# Patient Record
Sex: Male | Born: 1965
Health system: Southern US, Community
[De-identification: ages and names within clinical notes are randomized; demographics above are authoritative.]

## PROBLEM LIST (undated history)

## (undated) DIAGNOSIS — H40009 Preglaucoma, unspecified, unspecified eye: Secondary | ICD-10-CM

## (undated) DIAGNOSIS — Z8619 Personal history of other infectious and parasitic diseases: Secondary | ICD-10-CM

## (undated) DIAGNOSIS — M549 Dorsalgia, unspecified: Secondary | ICD-10-CM

## (undated) DIAGNOSIS — E119 Type 2 diabetes mellitus without complications: Secondary | ICD-10-CM

## (undated) DIAGNOSIS — G8929 Other chronic pain: Secondary | ICD-10-CM

## (undated) HISTORY — PX: NO PAST SURGERIES: SHX2092

## (undated) HISTORY — DX: Type 2 diabetes mellitus without complications: E11.9

## (undated) HISTORY — DX: Other chronic pain: G89.29

## (undated) HISTORY — DX: Preglaucoma, unspecified, unspecified eye: H40.009

## (undated) HISTORY — DX: Personal history of other infectious and parasitic diseases: Z86.19

## (undated) HISTORY — DX: Dorsalgia, unspecified: M54.9

---

## 2012-10-02 ENCOUNTER — Ambulatory Visit (INDEPENDENT_AMBULATORY_CARE_PROVIDER_SITE_OTHER): Payer: BC Managed Care – PPO | Admitting: Internal Medicine

## 2012-10-02 VITALS — BP 114/76 | HR 81 | Temp 98.0°F | Resp 17 | Ht 71.0 in | Wt 197.0 lb

## 2012-10-02 DIAGNOSIS — Z7185 Encounter for immunization safety counseling: Secondary | ICD-10-CM

## 2012-10-02 DIAGNOSIS — Z0289 Encounter for other administrative examinations: Secondary | ICD-10-CM

## 2012-10-02 DIAGNOSIS — R229 Localized swelling, mass and lump, unspecified: Secondary | ICD-10-CM

## 2012-10-02 DIAGNOSIS — M779 Enthesopathy, unspecified: Secondary | ICD-10-CM

## 2012-10-02 DIAGNOSIS — Z7189 Other specified counseling: Secondary | ICD-10-CM

## 2012-10-02 DIAGNOSIS — R2231 Localized swelling, mass and lump, right upper limb: Secondary | ICD-10-CM

## 2012-10-02 NOTE — Progress Notes (Signed)
  Subjective:    Patient ID: Samuel Rodriguez, male    DOB: 01-25-1966, 47 y.o.   MRN: [id number]  HPI Needs school physical, also has enlarging mass right upper arm, is changing and getting firmer. Has hx of locking or cramping left middle finger. Needs PPD [place].   Review of Systems All neg otherwise    Objective:   Physical Exam  Vitals reviewed. Constitutional: He is oriented to person, place, and time. He appears well-developed and well-nourished. No distress.  Neck: Normal range of motion.  Cardiovascular: Normal rate, regular rhythm and normal heart sounds.   Pulmonary/Chest: Effort normal and breath sounds normal.  Abdominal: Soft. There is no tenderness.  Musculoskeletal: Normal range of motion.  Neurological: He is alert and oriented to person, place, and time.  Skin: Rash noted.  Psychiatric: He has a normal mood and affect. His behavior is normal. Judgment and thought content normal.   Lesion right upper arm very firm and partially fixed       Assessment & Plan:  Skin lesion right upper arm/refer to derm to remove PPD left fore arm/read 48-72 hrs

## 2012-10-02 NOTE — Progress Notes (Signed)
  Tuberculosis Risk Questionnaire  1. Yes Were you born outside the [place] in one of the following parts of the world: [place], [place], [place], [place] or [place]?    2. No Have you traveled outside the [place] and lived for more than one month in one of the following parts of the world: [place], [place], [place], [place] or [place]?    3. No Do you have a compromised immune system such as from any of the following conditions:HIV/AIDS, organ or bone marrow transplantation, diabetes, immunosuppressive medicines (e.g. Prednisone, Remicaide), leukemia, lymphoma, cancer of the head or neck, gastrectomy or jejunal bypass, end-stage renal disease (on dialysis), or silicosis?     4. No Have you ever or do you plan on working in: a residential care center, a health care facility, a jail or prison or homeless shelter?    5. No Have you ever: injected illegal drugs, used crack cocaine, lived in a homeless shelter  or been in jail or prison?     6. No Have you ever been exposed to anyone with infectious tuberculosis?    Tuberculosis Symptom Questionnaire  Do you currently have any of the following symptoms?  1. No Unexplained cough lasting more than 3 weeks?   2. No Unexplained fever lasting more than 3 weeks.   3. No Night Sweats (sweating that leaves the bedclothes and sheets wet)     4. No Shortness of Breath   5. No Chest Pain   6. No Unintentional weight loss    7. No  Unexplained fatigue (very tired for no reason)   

## 2012-10-02 NOTE — Patient Instructions (Signed)
DASH Diet  The DASH diet stands for "Dietary Approaches to Stop Hypertension." It is a healthy eating plan that has been shown to reduce high blood pressure (hypertension) in as little as 14 days, while also possibly providing other significant health benefits. These other health benefits include reducing the risk of breast cancer after menopause and reducing the risk of type 2 diabetes, heart disease, colon cancer, and stroke. Health benefits also include weight loss and slowing kidney failure in patients with chronic kidney disease.   DIET GUIDELINES  · Limit salt (sodium). Your diet should contain less than 1500 mg of sodium daily.  · Limit refined or processed carbohydrates. Your diet should include mostly whole grains. Desserts and added sugars should be used sparingly.  · Include small amounts of heart-healthy fats. These types of fats include nuts, oils, and tub margarine. Limit saturated and trans fats. These fats have been shown to be harmful in the body.  CHOOSING FOODS   The following food groups are based on a 2000 calorie diet. See your Registered Dietitian for individual calorie needs.  Grains and Grain Products (6 to 8 servings daily)  · Eat More Often: Whole-wheat bread, brown rice, whole-grain or wheat pasta, quinoa, popcorn without added fat or salt (air popped).  · Eat Less Often: White bread, white pasta, white rice, cornbread.  Vegetables (4 to 5 servings daily)  · Eat More Often: Fresh, frozen, and canned vegetables. Vegetables may be raw, steamed, roasted, or grilled with a minimal amount of fat.  · Eat Less Often/Avoid: Creamed or fried vegetables. Vegetables in a cheese sauce.  Fruit (4 to 5 servings daily)  · Eat More Often: All fresh, canned (in natural juice), or frozen fruits. Dried fruits without added sugar. One hundred percent fruit juice (½ cup [237 mL] daily).  · Eat Less Often: Dried fruits with added sugar. Canned fruit in light or heavy syrup.  Lean Meats, Fish, and Poultry (2  servings or less daily. One serving is 3 to 4 oz [85-114 g]).  · Eat More Often: Ninety percent or leaner ground beef, tenderloin, sirloin. Round cuts of beef, chicken breast, turkey breast. All fish. Grill, bake, or broil your meat. Nothing should be fried.  · Eat Less Often/Avoid: Fatty cuts of meat, turkey, or chicken leg, thigh, or wing. Fried cuts of meat or fish.  Dairy (2 to 3 servings)  · Eat More Often: Low-fat or fat-free milk, low-fat plain or light yogurt, reduced-fat or part-skim cheese.  · Eat Less Often/Avoid: Milk (whole, 2%). Whole milk yogurt. Full-fat cheeses.  Nuts, Seeds, and Legumes (4 to 5 servings per week)  · Eat More Often: All without added salt.  · Eat Less Often/Avoid: Salted nuts and seeds, canned beans with added salt.  Fats and Sweets (limited)  · Eat More Often: Vegetable oils, tub margarines without trans fats, sugar-free gelatin. Mayonnaise and salad dressings.  · Eat Less Often/Avoid: Coconut oils, palm oils, butter, stick margarine, cream, half and half, cookies, candy, pie.  FOR MORE INFORMATION  The Dash Diet Eating Plan: www.dashdiet.org  Document Released: 01/24/2011 Document Revised: 04/29/2011 Document Reviewed: 01/24/2011  ExitCare® Patient Information ©2014 [employer].

## 2012-10-04 ENCOUNTER — Ambulatory Visit (INDEPENDENT_AMBULATORY_CARE_PROVIDER_SITE_OTHER): Payer: BC Managed Care – PPO

## 2012-10-04 DIAGNOSIS — Z7185 Encounter for immunization safety counseling: Secondary | ICD-10-CM

## 2012-10-04 DIAGNOSIS — Z111 Encounter for screening for respiratory tuberculosis: Secondary | ICD-10-CM

## 2012-10-04 DIAGNOSIS — Z7189 Other specified counseling: Secondary | ICD-10-CM

## 2012-10-04 LAB — TB SKIN TEST
Induration: NEGATIVE mm
TB Skin Test: NEGATIVE

## 2013-12-17 ENCOUNTER — Emergency Department (HOSPITAL_COMMUNITY)
Admission: EM | Admit: 2013-12-17 | Discharge: 2013-12-17 | Disposition: A | Discharge status: Home / Self Care | Payer: No Typology Code available for payment source | Attending: Emergency Medicine | Admitting: Emergency Medicine

## 2013-12-17 ENCOUNTER — Emergency Department (HOSPITAL_COMMUNITY): Payer: No Typology Code available for payment source

## 2013-12-17 ENCOUNTER — Encounter (HOSPITAL_COMMUNITY): Payer: Self-pay | Admitting: Emergency Medicine

## 2013-12-17 DIAGNOSIS — M5416 Radiculopathy, lumbar region: Secondary | ICD-10-CM | POA: Insufficient documentation

## 2013-12-17 DIAGNOSIS — R52 Pain, unspecified: Secondary | ICD-10-CM

## 2013-12-17 MED ORDER — OXYCODONE-ACETAMINOPHEN 5-325 MG PO TABS: 1.0000 | ORAL_TABLET | ORAL | Status: DC | PRN

## 2013-12-17 MED ORDER — IBUPROFEN 800 MG PO TABS: 800.0000 mg | ORAL_TABLET | Freq: Three times a day (TID) | ORAL | Status: DC | PRN

## 2013-12-17 MED ORDER — CYCLOBENZAPRINE HCL 10 MG PO TABS: 10.0000 mg | ORAL_TABLET | Freq: Three times a day (TID) | ORAL | Status: DC | PRN

## 2013-12-17 MED ORDER — HYDROMORPHONE HCL 1 MG/ML IJ SOLN
2.0000 mg | Freq: Once | INTRAMUSCULAR | Status: AC
  Administered 2013-12-17: 2 mg via INTRAMUSCULAR
  Filled 2013-12-17: qty 2

## 2013-12-17 MED ORDER — PREDNISONE 50 MG PO TABS: ORAL_TABLET | ORAL | Status: DC

## 2013-12-17 NOTE — ED Notes (Signed)
The pt is c/o lt hip and entire lt leg pain for 2 weeks getting worse.  If he stands his pain is better. He also has some lt leg numbness

## 2013-12-17 NOTE — ED Provider Notes (Signed)
CSN: [id number]     Arrival date & time 12/17/13  2043 History  This chart was scribed for Samuel Rodriguez, [place], working with [name], DO found by Elon SpannerGarrett Cook, ED Scribe. This patient was seen in room TR11C/TR11C and the patient's care was started at 9:17 PM.   Chief Complaint  Patient presents with  . Hip Pain   The history is provided by the patient. No language interpreter was used.   HPI Comments: Dewitt HoesFernando V Rodriguez is a 48 y.o. male who presents to the Emergency Department complaining of left hip pain onset two weeks ago.  Patient reports he slept on a hard floor on his right side for 15 minutes two weeks ago.  When he awoke he turned onto his left side and heard a sound from his left hip.  Since this time the patient has had waxing and waning, worsening left hip, left knee, and left foot pain.  He reports that these three points of pain are related typically with one preceding the other and causing radiation either up or down his leg.  Patient reports that initially bearing weight is painful but the pain improves after walking several paces.  Similarly, the pain is reduced throughout the day as the patient feels his joint has "warmed up".  However, overuse can cause an increase in pain, such as walking long distances or bearing weight for long periods of time.  He also reports decreased sensation in his left leg from mid-calf down.  Patient has taken muscle relaxers and ibuprofen at home without relief.   Patient denies fall, injury.  Patient denies history of cancer.  Patient denies history of IV drug use. Patient denies fever, bowel/bladder incontinence or retention, abdominal pain, urinary or bowel symptoms .  Does feel weaker in his left leg secondary to pain.  NKA.   History reviewed. No pertinent past medical history. History reviewed. No pertinent past surgical history. No family history on file. History  Substance Use Topics  . Smoking status: Never Smoker   . Smokeless tobacco: Not  on file  . Alcohol Use: Yes    Review of Systems  Constitutional: Positive for chills. Negative for fever.  Cardiovascular: Negative for leg swelling.  Gastrointestinal: Negative for abdominal pain.  Musculoskeletal: Positive for arthralgias and back pain. Negative for gait problem and myalgias.  Skin: Negative for color change and wound.  Allergic/Immunologic: Negative for immunocompromised state.  Neurological: Positive for weakness and numbness.      Allergies  Review of patient's allergies indicates no known allergies.  Home Medications   Prior to Admission medications   Not on File   BP 146/97  Pulse 88  Temp(Src) 98.2 F (36.8 C)  Resp 18  Ht 5\' 10"  (1.778 m)  Wt 194 lb (87.998 kg)  BMI 27.84 kg/m2  SpO2 97% Physical Exam  Nursing note and vitals reviewed. Constitutional: He appears well-developed and well-nourished. No distress.  HENT:  Head: Normocephalic and atraumatic.  Neck: Neck supple.  Pulmonary/Chest: Effort normal.  Abdominal: Soft. He exhibits no distension and no mass. There is no tenderness. There is no rebound and no guarding.  Musculoskeletal:       Legs: Straight leg raise reproduces pain.  Pain 5/5 throughout left leg with exception of flexion of hip, somewhat decreased, likely secondary to pain.   Distal pulses intact.  No edema.    Neurological: He is alert.  Skin: He is not diaphoretic.  Psychiatric: He has a normal mood and  affect. His behavior is normal.    ED Course  Procedures (including critical care time)  DIAGNOSTIC STUDIES: Oxygen Saturation is 97% on RA, normal by my interpretation.    COORDINATION OF CARE:  9:30 PM Will order x-ray of lumbar back and hip.  Will order pain medication.  Will provide referral.  Labs Review Labs Reviewed - No data to display  Imaging Review Dg Lumbar Spine Complete  12/17/2013   CLINICAL DATA:  LEFT hip and leg pain for 2 weeks, after lying on floor. Intermittent severe pain.  EXAM:  LUMBAR SPINE - COMPLETE 4+ VIEW  COMPARISON:  None.  FINDINGS: Five non rib-bearing lumbar-type vertebral bodies are intact and aligned with maintenance of the lumbar lordosis. Mild broad levoscoliosis on this nonweightbearing examination. No pars interarticularis defects. Mild L5-S1 disc degeneration. Mild ventral endplate spurring at all lumbar levels. Moderate lower lumbar facet arthropathy. No destructive bony lesions.  Sacroiliac joints are symmetric. Included prevertebral and paraspinal soft tissue planes are non-suspicious.  IMPRESSION: Mild degenerative changes lumbar spine without acute fracture deformity or malalignment.   Electronically Signed   By: Awilda Metroourtnay  Bloomer   On: 12/17/2013 22:39   Dg Hip Complete Left  12/17/2013   CLINICAL DATA:  Left hip and left leg pain for 2 weeks.  EXAM: LEFT HIP - COMPLETE 2+ VIEW  COMPARISON:  None.  FINDINGS: There is no evidence of hip fracture or dislocation. There is no evidence of arthropathy or other focal bone abnormality.  IMPRESSION: Negative.   Electronically Signed   By: Signa Kellaylor  Stroud M.D.   On: 12/17/2013 22:44     EKG Interpretation None      10:54 PM Patient is now pain free.  Gait is normal.  Strength 5/5 throughout left lower extremity though possible slight decrease still in hip flexion, however this is very minimal.   MDM   Final diagnoses:  Pain  Lumbar radiculopathy, acute    Afebrile, nontoxic patient with left lower extremity pain, positive straight leg raise on exam.  Suspect lumbar radiculopathy.  Complete pain relief with IM dilaudid.  Gait is normal.  He has perhaps the slightest decrease in strength of hip flexion, unclear if from continued pain or true weakness, I would still classify his strength in this area as 5/5, it is simply not as strong as the right side.  No red flags for back pain.  Xrays of lumbar spine shows DDD, hip xray normal.  D/C home with prednisone, motrin, flexeril, percocet.  Neurosurgery follow up.   Discussed result, findings, treatment, and follow up  with patient.  Pt given return precautions.  Pt verbalizes understanding and agrees with plan.       I personally performed the services described in this documentation, which was scribed in my presence. The recorded information has been reviewed and is accurate.    EverettEmily Famous Eisenhardt, PA-C 12/17/13 [phone]

## 2013-12-17 NOTE — Discharge Instructions (Signed)
Read the information below.  Use the prescribed medication as directed.  Please discuss all new medications with your pharmacist.  Do not take additional tylenol while taking the prescribed pain medication to avoid overdose.  You may return to the Emergency Department at any time for worsening condition or any new symptoms that concern you.   If you develop fevers, loss of control of bowel or bladder, weakness or numbness in your legs, or are unable to walk, return to the ER for a recheck.  ° ° °Lumbosacral Radiculopathy °Lumbosacral radiculopathy is a pinched nerve or nerves in the low back (lumbosacral area). When this happens you may have weakness in your legs and may not be able to stand on your toes. You may have pain going down into your legs. There may be difficulties with walking normally. There are many causes of this problem. Sometimes this may happen from an injury, or simply from arthritis or boney problems. It may also be caused by other illnesses such as diabetes. If there is no improvement after treatment, further studies may be done to find the exact cause. °DIAGNOSIS  °X-rays may be needed if the problems become long standing. Electromyograms may be done. This study is one in which the working of nerves and muscles is studied. °HOME CARE INSTRUCTIONS  °· Applications of ice packs may be helpful. Ice can be used in a plastic bag with a towel around it to prevent frostbite to skin. This may be used every 2 hours for 20 to 30 minutes, or as needed, while awake, or as directed by your caregiver. °· Only take over-the-counter or prescription medicines for pain, discomfort, or fever as directed by your caregiver. °· If physical therapy was prescribed, follow your caregiver's directions. °SEEK IMMEDIATE MEDICAL CARE IF:  °· You have pain not controlled with medications. °· You seem to be getting worse rather than better. °· You develop increasing weakness in your legs. °· You develop loss of bowel or  bladder control. °· You have difficulty with walking or balance, or develop clumsiness in the use of your legs. °· You have a fever. °MAKE SURE YOU:  °· Understand these instructions. °· Will watch your condition. °· Will get help right away if you are not doing well or get worse. °Document Released: 02/04/2005 Document Revised: 04/29/2011 Document Reviewed: 09/25/2007 °ExitCare® Patient Information ©2015 [employer]. This information is not intended to replace advice given to you by your health care provider. Make sure you discuss any questions you have with your health care provider. ° °

## 2013-12-18 NOTE — ED Provider Notes (Signed)
Medical screening examination/treatment/procedure(s) were performed by non-physician practitioner and as supervising physician I was immediately available for consultation/collaboration.   EKG Interpretation None        Layla MawKristen N Ward, DO 12/18/13 0002

## 2013-12-30 ENCOUNTER — Other Ambulatory Visit: Payer: Self-pay | Admitting: Neurosurgery

## 2013-12-30 DIAGNOSIS — M5416 Radiculopathy, lumbar region: Secondary | ICD-10-CM

## 2013-12-31 ENCOUNTER — Ambulatory Visit
Admission: RE | Admit: 2013-12-31 | Discharge: 2013-12-31 | Disposition: A | Discharge status: Home / Self Care | Payer: No Typology Code available for payment source | Source: Ambulatory Visit | Attending: Neurosurgery | Admitting: Neurosurgery

## 2013-12-31 ENCOUNTER — Other Ambulatory Visit: Payer: Self-pay

## 2013-12-31 ENCOUNTER — Other Ambulatory Visit: Payer: Self-pay | Admitting: Neurosurgery

## 2013-12-31 DIAGNOSIS — M5416 Radiculopathy, lumbar region: Secondary | ICD-10-CM

## 2014-01-23 ENCOUNTER — Other Ambulatory Visit: Payer: Self-pay | Admitting: Neurosurgery

## 2014-01-23 ENCOUNTER — Ambulatory Visit
Admission: RE | Admit: 2014-01-23 | Discharge: 2014-01-23 | Disposition: A | Discharge status: Home / Self Care | Payer: No Typology Code available for payment source | Source: Ambulatory Visit | Attending: Neurosurgery | Admitting: Neurosurgery

## 2014-01-23 ENCOUNTER — Ambulatory Visit: Admission: RE | Admit: 2014-01-23 | Payer: No Typology Code available for payment source | Source: Ambulatory Visit

## 2014-01-23 DIAGNOSIS — M5416 Radiculopathy, lumbar region: Secondary | ICD-10-CM

## 2015-05-10 ENCOUNTER — Telehealth: Payer: Self-pay | Admitting: General Practice

## 2015-05-10 NOTE — Telephone Encounter (Signed)
LVM for pt to return call to schedule appt as new pt with Dr Drue NovelPaz (ok per PCP)

## 2015-05-12 ENCOUNTER — Ambulatory Visit (INDEPENDENT_AMBULATORY_CARE_PROVIDER_SITE_OTHER): Payer: BC Managed Care – PPO | Admitting: Internal Medicine

## 2015-05-12 ENCOUNTER — Encounter: Payer: Self-pay | Admitting: Internal Medicine

## 2015-05-12 VITALS — BP 132/76 | HR 77 | Temp 97.6°F | Ht 70.5 in | Wt 197.5 lb

## 2015-05-12 DIAGNOSIS — M6530 Trigger finger, unspecified finger: Secondary | ICD-10-CM | POA: Diagnosis not present

## 2015-05-12 DIAGNOSIS — R05 Cough: Secondary | ICD-10-CM

## 2015-05-12 DIAGNOSIS — Z7689 Persons encountering health services in other specified circumstances: Secondary | ICD-10-CM

## 2015-05-12 DIAGNOSIS — Z7189 Other specified counseling: Secondary | ICD-10-CM | POA: Diagnosis not present

## 2015-05-12 DIAGNOSIS — R059 Cough, unspecified: Secondary | ICD-10-CM

## 2015-05-12 DIAGNOSIS — Z114 Encounter for screening for human immunodeficiency virus [HIV]: Secondary | ICD-10-CM

## 2015-05-12 DIAGNOSIS — M549 Dorsalgia, unspecified: Secondary | ICD-10-CM

## 2015-05-12 DIAGNOSIS — G629 Polyneuropathy, unspecified: Secondary | ICD-10-CM | POA: Diagnosis not present

## 2015-05-12 DIAGNOSIS — R2231 Localized swelling, mass and lump, right upper limb: Secondary | ICD-10-CM | POA: Diagnosis not present

## 2015-05-12 DIAGNOSIS — G8929 Other chronic pain: Secondary | ICD-10-CM | POA: Insufficient documentation

## 2015-05-12 DIAGNOSIS — Z09 Encounter for follow-up examination after completed treatment for conditions other than malignant neoplasm: Secondary | ICD-10-CM

## 2015-05-12 LAB — CBC WITH DIFFERENTIAL/PLATELET
Basophils Absolute: 0 10*3/uL (ref 0.0–0.1)
Basophils Relative: 0 % (ref 0–1)
Eosinophils Absolute: 0.1 10*3/uL (ref 0.0–0.7)
Eosinophils Relative: 2 % (ref 0–5)
HCT: 44.4 % (ref 39.0–52.0)
Hemoglobin: 15.4 g/dL (ref 13.0–17.0)
Lymphocytes Relative: 43 % (ref 12–46)
Lymphs Abs: 2.2 10*3/uL (ref 0.7–4.0)
MCH: 30.6 pg (ref 26.0–34.0)
MCHC: 34.7 g/dL (ref 30.0–36.0)
MCV: 88.1 fL (ref 78.0–100.0)
MPV: 10.2 fL (ref 8.6–12.4)
Monocytes Absolute: 0.4 10*3/uL (ref 0.1–1.0)
Monocytes Relative: 8 % (ref 3–12)
Neutro Abs: 2.4 10*3/uL (ref 1.7–7.7)
Neutrophils Relative %: 47 % (ref 43–77)
Platelets: 136 10*3/uL — ABNORMAL LOW (ref 150–400)
RBC: 5.04 MIL/uL (ref 4.22–5.81)
RDW: 13.5 % (ref 11.5–15.5)
WBC: 5.1 10*3/uL (ref 4.0–10.5)

## 2015-05-12 LAB — VITAMIN B12: Vitamin B-12: 675 pg/mL (ref 200–1100)

## 2015-05-12 LAB — FOLATE: Folate: >24 ng/mL (ref >5.4)

## 2015-05-12 LAB — HIV ANTIBODY (ROUTINE TESTING W REFLEX): HIV 1&2 Ab, 4th Generation: NONREACTIVE

## 2015-05-12 LAB — HEMOGLOBIN A1C
Hgb A1c MFr Bld: 6.3 % — ABNORMAL HIGH (ref <5.7)
Mean Plasma Glucose: 134 mg/dL — ABNORMAL HIGH (ref <117)

## 2015-05-12 LAB — TSH: TSH: 1.4 mIU/L (ref 0.40–4.50)

## 2015-05-12 NOTE — Progress Notes (Signed)
Pre visit review using our clinic review tool, if applicable. No additional management support is needed unless otherwise documented below in the visit note. 

## 2015-05-12 NOTE — Patient Instructions (Signed)
GO TO THE LAB :      Get the blood work     GO TO THE FRONT DESK Schedule your next appointment for a  Physical exam When?   4 to 5  months  Fasting?  yes

## 2015-05-12 NOTE — Progress Notes (Signed)
Subjective:    Patient ID: Samuel Rodriguez, male    DOB: 11-30-1965, 50 y.o.   MRN: [id number]  DOS:  05/12/2015 Type of visit - description : new pt Interval history: Patient has multiple concerns More than 2 years history of feet numbness and tingling, from the ankle down, symmetric, steady. Not getting better or worse. He has a history of back pain, 2015, see MRI report, was offered surgery but did not pursue it. Currently with no back pain. Numbness  started before the onset of back pain.  Skin lesion, right arm, for about 2 years ago --->  apparently started as an abscess , he was able to get discharge out of it but the place never  healed completely. Denies any pain at the present time.  Occasional cough, usually in cold environments.  Occasional trigger phenomena at the third right digit.    Review of Systems   Denies fever, chills. No weight loss No chest or difficulty breathing No nausea, vomiting, diarrhea. Admits to occasional wheezing, usually with URIs.  Past Medical History  Diagnosis Date  . History of chicken pox   . Chronic back pain     Dr. Wynetta Emery, "chipped disc"    Past Surgical History  Procedure Laterality Date  . No past surgeries      Social History   Social History  . Marital Status: Single    Spouse Name: N/A  . Number of Children: 0  . Years of Education: N/A   Occupational History  . school teacher    Social History Main Topics  . Smoking status: Never Smoker   . Smokeless tobacco: Not on file  . Alcohol Use: Yes  . Drug Use: Not on file  . Sexual Activity: Not on file   Other Topics Concern  . Not on file   Social History Narrative   Lives by himself   From  [place]      Family History  Problem Relation Age of Onset  . Diabetes Brother   . CAD Neg Hx   . Colon cancer Neg Hx   . Prostate cancer Neg Hx        Medication List    Notice  As of 05/12/2015 11:59 PM   You have not been prescribed any medications.           Objective:   Physical Exam  Skin:      BP 132/76 mmHg  Pulse 77  Temp(Src) 97.6 F (36.4 C) (Oral)  Ht 5' 10.5" (1.791 m)  Wt 197 lb 8 oz (89.585 kg)  BMI 27.93 kg/m2  SpO2 97% General:   Well developed, well nourished . NAD.  HEENT:  Normocephalic . Face symmetric, atraumatic Neck: No thyromegaly Lungs:   CTA B Normal respiratory effort, no intercostal retractions, no accessory muscle use. Heart: RRR,  no murmur.  no pretibial edema bilaterally  MSK: Hands, fingers normal to inspection on palpation, no trigger fonger today  Abdomen:  Not distended, soft, non-tender. No rebound or rigidity.  Skin: Not pale. Not jaundice Neurologic:  alert & oriented X3.  Speech normal, gait appropriate for age and unassisted. DTRs slightly decreased throughout but symmetric Pinprick examination of the feet with patchy decrease in sensitivity in the sock distribution. Psych--  Cognition and judgment appear intact.  Cooperative with normal attention span and concentration.  Behavior appropriate. No anxious or depressed appearing.    Assessment & Plan:   Assessment: Back pain , 11-2013, MRI, saw  Dr Wynetta Emeryram, Left L3-L4 problem, was rx surgery, only did PT, strength returned w/ PT  PLAN Neuropathy:  Sx c/w neuropathy, no previous workup,sx started before onset of back pain in 2015. Plan: CMP, TSH, B12, folic acid, TSH, A1c, HIV, sedimentation rate. Neurology referral for consideration of a NCS Mass , right arm: Hard mass for 2 years, refer to general surgery, likely will need a excision. Does not feel like a classic sebaceous cyst. Trigger finger phenomenon: Recommend observation Cough, bronchospasm? Cough triggered by cold enviroment, Reactive airway dz? Sx not  severe, rx observation RTC 4 months, CPX

## 2015-05-13 LAB — COMPREHENSIVE METABOLIC PANEL
ALT: 47 U/L — ABNORMAL HIGH (ref 9–46)
AST: 43 U/L — ABNORMAL HIGH (ref 10–35)
Albumin: 4.2 g/dL (ref 3.6–5.1)
Alkaline Phosphatase: 59 U/L (ref 40–115)
BUN: 19 mg/dL (ref 7–25)
CO2: 24 mmol/L (ref 20–31)
Calcium: 8.8 mg/dL (ref 8.6–10.3)
Chloride: 104 mmol/L (ref 98–110)
Creat: 1.02 mg/dL (ref 0.70–1.33)
Glucose, Bld: 117 mg/dL — ABNORMAL HIGH (ref 65–99)
Potassium: 4.2 mmol/L (ref 3.5–5.3)
Sodium: 141 mmol/L (ref 135–146)
Total Bilirubin: 0.6 mg/dL (ref 0.2–1.2)
Total Protein: 6.8 g/dL (ref 6.1–8.1)

## 2015-05-13 LAB — SEDIMENTATION RATE: Sed Rate: 1 mm/hr (ref 0–20)

## 2015-05-14 DIAGNOSIS — G629 Polyneuropathy, unspecified: Secondary | ICD-10-CM | POA: Insufficient documentation

## 2015-05-14 DIAGNOSIS — Z09 Encounter for follow-up examination after completed treatment for conditions other than malignant neoplasm: Secondary | ICD-10-CM | POA: Insufficient documentation

## 2015-05-14 DIAGNOSIS — R05 Cough: Secondary | ICD-10-CM | POA: Insufficient documentation

## 2015-05-14 DIAGNOSIS — R059 Cough, unspecified: Secondary | ICD-10-CM | POA: Insufficient documentation

## 2015-05-14 NOTE — Assessment & Plan Note (Signed)
Neuropathy:  Sx c/w neuropathy, no previous workup,sx started before onset of back pain in 2015. Plan: CMP, TSH, B12, folic acid, TSH, A1c, HIV, sedimentation rate. Neurology referral for consideration of a NCS Mass , right arm: Hard mass for 2 years, refer to general surgery, likely will need a excision. Does not feel like a classic sebaceous cyst. Trigger finger phenomenon: Recommend observation Cough, bronchospasm? Cough triggered by cold enviroment, Reactive airway dz? Sx not  severe, rx observation RTC 4 months, CPX

## 2015-05-16 ENCOUNTER — Encounter: Payer: Self-pay | Admitting: Internal Medicine

## 2015-05-19 MED ORDER — METFORMIN HCL 500 MG PO TABS: 500.0000 mg | ORAL_TABLET | Freq: Two times a day (BID) | ORAL | Status: DC

## 2015-05-19 NOTE — Telephone Encounter (Signed)
send Metformin 500 mg one tablet twice a day #60, 4 RF, pt knows   Received: Yesterday    Wanda PlumpJose E Paz, MD  Conrad BurlingtonKaylyn Breah Joa, CMA   Rx sent.

## 2015-06-05 ENCOUNTER — Encounter: Payer: Self-pay | Admitting: Neurology

## 2015-06-05 ENCOUNTER — Other Ambulatory Visit (INDEPENDENT_AMBULATORY_CARE_PROVIDER_SITE_OTHER): Payer: BC Managed Care – PPO

## 2015-06-05 ENCOUNTER — Ambulatory Visit (INDEPENDENT_AMBULATORY_CARE_PROVIDER_SITE_OTHER): Payer: BC Managed Care – PPO | Admitting: Neurology

## 2015-06-05 VITALS — BP 130/80 | HR 96 | Ht 70.5 in | Wt 195.1 lb

## 2015-06-05 DIAGNOSIS — R209 Unspecified disturbances of skin sensation: Secondary | ICD-10-CM | POA: Diagnosis not present

## 2015-06-05 DIAGNOSIS — E0842 Diabetes mellitus due to underlying condition with diabetic polyneuropathy: Secondary | ICD-10-CM

## 2015-06-05 LAB — SEDIMENTATION RATE: Sed Rate: 7 mm/hr (ref 0–22)

## 2015-06-05 NOTE — Patient Instructions (Addendum)
1.  NCS/EMG of the lower extremities 2.  Check blood work 3.  Return to clinic in 6 months   [facility]  Preventing Falls in the Home   Falls are common, often dreaded events in the lives of older people. Aside from the obvious injuries and even death that may result, falls can cause wide-ranging consequences including loss of independence, mental decline, decreased activity, and mobility. Younger people are also at risk of falling, especially those with chronic illnesses and fatigue.  Ways to reduce the risk for falling:  * Examine diet and medications. Warm foods and alcohol dilate blood vessels, which can lead to dizziness when standing. Sleep aids, antidepressants, and pain medications can also increase the likelihood of a fall.  * Get a vison exam. Poor vision, cataracts, and glaucoma increase the chances of falling.  * Check foot gear. Shoes should fit snugly and have a sturdy, nonskid sole and broad, low heel.  * Participate in a physician-approved exercise program to build and maintain muscle strength and improve balance and coordination.  * Increase vitamin D intake. Vitamin D improves muscle strength and increases the amount of calcium the body is able to absorb and deposit in bones.  How to prevent falls from common hazards:  * Floors - Remove all loose wires, cords, and throw rugs. Minimize clutter. Make sure rugs are anchored and smooth. Keep furniture in its usual place.  * Chairs - Use chairs with straight backs, armrests, and firm seats. Add firm cushions to existing pieces to add height.  * Bathroom - Install grab bars and non-skid tape in the tub or shower. Use a bathtub transfer bench or a shower chair with a back support. Use an elevated toilet seat and/or safety rails to assist standing from a low surface. Do not use towel racks or bathroom tissue holders to help you stand.  * Lighting - Make sure halls, stairways, and entrances are well-lit. Install a night light in  your bathroom or hallway. Make sure there is a light switch at the top and bottom of the staircase. Turn lights on if you get up in the middle of the night. Make sure lamps or light switches are within reach of the bed if you have to get up during the night.  * Kitchen - Install non-skid rubber mats near the sink and stove. Clean spills immediately. Store frequently used utensils, pots, and pans between waist and eye level. This helps prevent reaching and bending. Sit when getting things out of the lower cupboards.  * Living room / Bedrooms - Place furniture with wide spaces in between, giving enough room to move around. Establish a route through the living room that gives you something to hold onto as you walk.  * Stairs - Make sure treads, rails, and rugs are secure. Install a rail on both sides of the stairs. If stairs are a threat, it might be helpful to arrange most of your activities on the lower level to reduce the number of times you must climb the stairs.  * Entrances and doorways - Install metal handles on the walls adjacent to the doorknobs of all doors to make it more secure as you travel through the doorway.  Tips for maintaining balance:  * Keep at least one hand free at all times Try using a backpack or fanny pack to hold things rather than carrying them in your hands. Never carry objects in both hands when walking as this interferes with keeping your  balance.  * Attempt to swing both arms from front to back while walking. This might require a conscious effort if Parkinson's disease has diminished your movement. It will, however, help you to maintain balance and posture, and reduce fatigue.  * Consciously lift your feet off the ground when walking. Shuffling and dragging of the feet is a common culprit in losing your balance.  * When trying to navigate turns, use a "U" technique of facing forward and making a wide turn, rather than pivoting sharply.  * Try to stand with your feet  shoulder-length apart. When your feet are close together for any length of time, you increase your risk of losing your balance and falling.  * Do one thing at a time. Do not try to walk and accomplish another task, such as reading or looking around. The decrease in your automatic reflexes complicates motor function, so the less distraction, the better.  * Do not wear rubber or gripping soled shoes, they might "catch" on the floor and cause tripping.  * Move slowly when changing positions. Use deliberate, concentrated movements and, if needed, use a grab bar or walking aid. Count fifteen (15) seconds after standing to begin walking.  * If balance is a continuous problem, you might want to consider a walking aid such as a cane, walking stick, or walker. Once you have mastered walking with help, you may be ready to try it again on your own.  This information is provided by [facility] and is not intended to replace the medical advice of your physician or other health care providers. Please consult your physician or other health care providers for advice regarding your specific medical condition.

## 2015-06-05 NOTE — Progress Notes (Signed)
[facility] Clinic Note - Initial Visit   Date: 06/05/2015  Samuel Rodriguez MRN: [id number] DOB: [birth year]   Dear Dr. Larose Kells:  Thank you for your kind referral of Samuel Rodriguez for consultation of neuropathy. Although his history is well known to you, please allow [place] to reiterate it for the purpose of our medical record. The patient was accompanied to the clinic by self.    History of Present Illness: Samuel Rodriguez is a 50 y.o. right handed male with diabetes mellitus presenting for evaluation of numbness of the feet.    Starting around 2014, he began having numbness and tight sensation involving his feet.  He initially attributed this to his shoes, but then realized that despite changing his shoes, the symptoms are always present.  Recently, he began noticing that he was unable to feel the pedals to his car as easily as before.  He has numbness to the level of his ankles.  In the evening, he has tingling sensation, as if there are bugs crawling on him.  He denies any weakness or falls.  He does endorse imbalance.  He has noticed that temperature changes, exacerbates symptoms.    He reports to drinking heavily for 10 years (ages [age]), usually about a bottle of wine followed by 15 cans of beer nightly.  Currently, drinks one glass of wine 3-5 times per week.    Out-side paper records, electronic medical record, and images have been reviewed where available and summarized as:  MRI lumbar spine wo contrast 01/23/2014:  Focal LEFT paracentral extrusion at L3-4. LEFT L4 nerve root compression. Central and leftward protrusion at L4-5 extending to the foramen.  LEFT L4 and LEFT L5 nerve root impingement are likely.  Lab Results  Component Value Date   TSH 1.40 05/12/2015   Lab Results  Component Value Date   MBEMLJQG92 010 05/12/2015   Lab Results  Component Value Date   FOLATE >24.0 05/12/2015   Lab Results  Component Value Date   HGBA1C 6.3*  05/12/2015    Past Medical History  Diagnosis Date  . History of chicken pox   . Chronic back pain     Dr. Saintclair Halsted, "chipped disc"    Past Surgical History  Procedure Laterality Date  . No past surgeries       Medications:  Outpatient Encounter Prescriptions as of 06/05/2015  Medication Sig  . MAGNESIUM CITRATE PO Take by mouth.  . metFORMIN (GLUCOPHAGE) 500 MG tablet Take 1 tablet (500 mg total) by mouth 2 (two) times daily with a meal.   No facility-administered encounter medications on file as of 06/05/2015.     Allergies: No Known Allergies  Family History: Family History  Problem Relation Age of Onset  . Diabetes Brother   . CAD Neg Hx   . Colon cancer Neg Hx   . Prostate cancer Neg Hx     Social History: Social History  Substance Use Topics  . Smoking status: Never Smoker   . Smokeless tobacco: Never Used  . Alcohol Use: 0.0 oz/week    0 Standard drinks or equivalent per week   Social History   Social History Narrative   Lives by himself in a condominium.  No children.  Works as a Pharmacist, hospital.  Education: Masters   From  [place]     Review of Systems:  CONSTITUTIONAL: No fevers, chills, night sweats, or weight loss.   EYES: No visual changes or eye pain ENT: No hearing  changes.  No history of nose bleeds.   RESPIRATORY: No cough, wheezing and shortness of breath.   CARDIOVASCULAR: Negative for chest pain, and palpitations.   GI: Negative for abdominal discomfort, blood in stools or black stools.  No recent change in bowel habits.   GU:  No history of incontinence.   MUSCLOSKELETAL: No history of joint pain or swelling.  No myalgias.   SKIN: Negative for lesions, rash, and itching.   HEMATOLOGY/ONCOLOGY: Negative for prolonged bleeding, bruising easily, and swollen nodes.  No history of cancer.   ENDOCRINE: Negative for cold or heat intolerance, polydipsia or goiter.   PSYCH:  No depression or anxiety symptoms.   NEURO: As Above.   Vital Signs:  BP  130/80 mmHg  Pulse 96  Ht 5' 10.5" (1.791 m)  Wt 195 lb 2 oz (88.508 kg)  BMI 27.59 kg/m2  SpO2 96%   General Medical Exam:   General:  Well appearing, comfortable.   Eyes/ENT: see cranial nerve examination.   Neck: No masses appreciated.  Full range of motion without tenderness.  No carotid bruits. Respiratory:  Clear to auscultation, good air entry bilaterally.   Cardiac:  Regular rate and rhythm, no murmur.   Extremities:  No deformities, edema, or skin discoloration.  Skin:  No rashes or lesions.  Neurological Exam: MENTAL STATUS including orientation to time, place, person, recent and remote memory, attention span and concentration, language, and fund of knowledge is normal.  Speech is not dysarthric.  CRANIAL NERVES: II:  No visual field defects.  Unremarkable fundi.   III-IV-VI: Pupils equal round and reactive to light.  Normal conjugate, extra-ocular eye movements in all directions of gaze.  No nystagmus.  No ptosis.   V:  Normal facial sensation.    VII:  Normal facial symmetry and movements.  No pathologic facial reflexes.  VIII:  Normal hearing and vestibular function.   IX-X:  Normal palatal movement.   XI:  Normal shoulder shrug and head rotation.   XII:  Normal tongue strength and range of motion, no deviation or fasciculation.  MOTOR:  No atrophy, fasciculations or abnormal movements.  No pronator drift.  Tone is normal.    Right Upper Extremity:    Left Upper Extremity:    Deltoid  5/5   Deltoid  5/5   Biceps  5/5   Biceps  5/5   Triceps  5/5   Triceps  5/5   Wrist extensors  5/5   Wrist extensors  5/5   Wrist flexors  5/5   Wrist flexors  5/5   Finger extensors  5/5   Finger extensors  5/5   Finger flexors  5/5   Finger flexors  5/5   Dorsal interossei  5/5   Dorsal interossei  5/5   Abductor pollicis  5/5   Abductor pollicis  5/5   Tone (Ashworth scale)  0  Tone (Ashworth scale)  0   Right Lower Extremity:    Left Lower Extremity:    Hip flexors  5/5    Hip flexors  5/5   Hip extensors  5/5   Hip extensors  5/5   Knee flexors  5/5   Knee flexors  5/5   Knee extensors  5/5   Knee extensors  5/5   Dorsiflexors  5/5   Dorsiflexors  5/5   Plantarflexors  5/5   Plantarflexors  5/5   Toe extensors  5/5   Toe extensors  5/5   Toe flexors  5/5  Toe flexors  5/5   Tone (Ashworth scale)  0  Tone (Ashworth scale)  0   MSRs:  Right                                                                 Left brachioradialis 2+  brachioradialis 2+  biceps 2+  biceps 2+  triceps 2+  triceps 2+  patellar 2+  patellar 2+  ankle jerk 0  ankle jerk 0  Hoffman no  Hoffman no  plantar response down  plantar response down   SENSORY:  Reduced vibration, pin prick, and light touch distal to ankles bilaterally.  Temperature is intact.  There is mild sway with Romberg's testing.   COORDINATION/GAIT: Normal finger-to- nose-finger and heel-to-shin.  Intact rapid alternating movements bilaterally.  Able to rise from a chair without using arms.  Gait narrow based and stable. Tandem and stressed gait intact.    IMPRESSION: Mr. Matters is a 50 year-old gentleman referred for evaluation of bilateral feel paresthesias. 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 I had extensive discussion with the patient regarding the pathogenesis, etiology, management, and natural course of neuropathy. Neuropathy tends to be slowly progressive, especially if a treatable etiology is not identified. His risk factors include previous 10 year history of significant alcohol use during his 44s and prediabetes.  His current alcohol use is unlikely to be contributing to his symptoms.  For completeness, I would like to test for treatable causes of neuropathy. Fortunately, he denies painful paresthesias so there is no role of medications at this time.     PLAN/RECOMMENDATIONS:  1.  NCS/EMG of the lower extremities 2.  Check ESR, SSA/B, copper, SPEP with  IFE, vitamin B1 3.  Tight glycemic control encouraged 4.  Fall precautions discussed  Return to clinic in 6 months.   The duration of this appointment visit was 45 minutes of face-to-face time with the patient.  Greater than 50% of this time was spent in counseling, explanation of diagnosis, planning of further management, and coordination of care.   Thank you for allowing me to participate in patient's care.  If I can answer any additional questions, I would be pleased to do so.    Sincerely,    Donika K. Posey Pronto, DO

## 2015-06-06 ENCOUNTER — Ambulatory Visit (INDEPENDENT_AMBULATORY_CARE_PROVIDER_SITE_OTHER): Payer: BC Managed Care – PPO | Admitting: Neurology

## 2015-06-06 DIAGNOSIS — R209 Unspecified disturbances of skin sensation: Secondary | ICD-10-CM

## 2015-06-06 DIAGNOSIS — E0842 Diabetes mellitus due to underlying condition with diabetic polyneuropathy: Secondary | ICD-10-CM | POA: Diagnosis not present

## 2015-06-06 LAB — SJOGREN'S SYNDROME ANTIBODS(SSA + SSB)
SSA (Ro) (ENA) Antibody, IgG: <1
SSB (La) (ENA) Antibody, IgG: <1

## 2015-06-06 NOTE — Procedures (Signed)
[facility]  [address], [address]  [place], [place] [address] Tel: [phone] Fax:  [phone] Test Date:  06/06/2015  Patient: Samuel Rodriguez DOB: 1965-11-15 Physician: Nita Sickle, DO  Sex: Male Height:  Ref Phys: Nita Sickle, DO  ID#: [id number] Temp: 33.1C Technician: Judie Petit. Dean   Patient Complaints: This is a 50 year old gentleman referred for evaluation of bilateral feet paresthesias.  NCV & EMG Findings: Extensive electrodiagnostic testing of the right lower extremity and additional studies of the left shows: 1. Bilateral sural and superficial peroneal sensory responses are absent. 2. Bilateral tibial and peroneal (EDB) motor responses are markedly reduced amplitude, slowed conduction velocity (23-38 m/s), and temporal dispersion. Bilateral peroneal motor response recording at the tibialis anterior show normal amplitude and conduction velocity, however, there is conduction block in the right. 3. Bilateral tibial H reflexes are prolonged. 4. There is active on chronic motor axon loss changes affecting the muscles below the knee bilaterally. Sparse chronic motor axon loss changes are seen in the right gluteus medius muscle. There is no evidence of active denervation involving the lower lumbar paraspinal muscles.   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  Overall, these findings are severe in degree electrically.   ___________________________ Nita Sickle, DO    Nerve Conduction Studies Anti Sensory Summary Table   Site NR Peak (ms) Norm Peak (ms) P-T Amp (V) Norm P-T Amp  Left Sup Peroneal Anti Sensory (Ant Lat Mall)  33.1C  12 cm NR  <4.6  >4  Right Sup Peroneal Anti Sensory (Ant Lat Mall)  33.1C  12 cm NR  <4.6  >4  Left Sural Anti Sensory (Lat Mall)  33.1C  Calf NR  <4.6  >4  Right Sural Anti Sensory  (Lat Mall)  33.1C  Calf NR  <4.6  >4   Motor Summary Table   Site NR Onset (ms) Norm Onset (ms) O-P Amp (mV) Norm O-P Amp Site1 Site2 Delta-0 (ms) Dist (cm) Vel (m/s) Norm Vel (m/s)  Left Peroneal Motor (Ext Dig Brev)  33.1C  Ankle    4.5 <6.0 0.3 >2.5 B Fib Ankle 13.9 35.0 25 >40  B Fib    18.4  0.3  Poplt B Fib 2.6 10.0 38 >40  Poplt    21.0  0.3         Right Peroneal Motor (Ext Dig Brev)  33.1C  Ankle    4.8 <6.0 1.1 >2.5 B Fib Ankle 12.5 33.0 26 >40  B Fib    17.3  0.9  Poplt B Fib 3.7 10.0 27 >40  Poplt    21.0  0.7         Left Peroneal TA Motor (Tib Ant)  33.1C  Fib Head    3.1 <4.5 3.7 >3 Poplit Fib Head 2.1 10.0 48 >40  Poplit    5.2  3.1         Right Peroneal TA Motor (Tib Ant)  33.1C  Fib Head    3.4 <4.5 3.0 >3 Poplit Fib Head 2.1 10.0 48 >40  Poplit    5.5  2.1         Left Tibial Motor (Abd [place] Brev)  33.1C  Ankle    4.6 <6.0 0.2 >4 Knee Ankle 14.2 40.0 28 >40  Knee    18.8  0.1         Right Tibial Motor (Abd [place]  Brev)  33.1C  Ankle    5.0 <6.0 0.4 >4 Knee Ankle 17.1 39.0 23 >40  Knee    22.1  0.1          H Reflex Studies   NR H-Lat (ms) Lat Norm (ms) L-R H-Lat (ms) M-Lat (ms) HLat-MLat (ms)  Left Tibial (Gastroc)  33.1C     46.53 <35 0.14 4.22 42.31  Right Tibial (Gastroc)  33.1C     46.67 <35 0.14 4.90 41.77   EMG   Side Muscle Ins Act Fibs Psw Fasc Number Recrt Dur Dur. Amp Amp. Poly Poly. Comment  Right AntTibialis Nml 1+ Nml Nml 2- Rapid Many 1+ Many 1+ Some 1+ N/A  Right Gastroc Nml 2+ Nml 1+ 2- Rapid Many 1+ Many 1+ Some 1+ N/A  Right Flex Dig Long Nml Nml Nml Nml 2- Rapid Many 1+ Many 1+ Some 1+ N/A  Right RectFemoris Nml Nml Nml Nml Nml Nml Nml Nml Nml Nml Nml Nml N/A  Right BicepsFemS Nml Nml Nml Nml Nml Nml Nml Nml Nml Nml Nml Nml N/A  Right GluteusMed Nml Nml Nml Nml 1- Mod-R Few 1+ Few 1+ Nml Nml N/A  Left AntTibialis Nml 2+ Nml 1+ 2- Rapid Many 1+ Many 1+ Some 1+ N/A  Left Gastroc Nml 2+ Nml Nml 2- Rapid Many 1+ Many 1+ Some 1+  N/A  Left RectFemoris Nml Nml Nml Nml Nml Nml Nml Nml Nml Nml Nml Nml N/A  Left GluteusMed Nml Nml Nml Nml Nml Nml Nml Nml Nml Nml Nml Nml N/A  Left Lumbo Parasp Low Nml Nml Nml Nml Nml Nml Nml Nml Nml Nml Nml Nml N/A      Waveforms:

## 2015-06-07 LAB — PROTEIN ELECTROPHORESIS, SERUM
Albumin ELP: 4.5 g/dL (ref 3.8–4.8)
Alpha-1-Globulin: 0.2 g/dL (ref 0.2–0.3)
Alpha-2-Globulin: 0.7 g/dL (ref 0.5–0.9)
Beta 2: 0.4 g/dL (ref 0.2–0.5)
Beta Globulin: 0.5 g/dL (ref 0.4–0.6)
Gamma Globulin: 1 g/dL (ref 0.8–1.7)
Total Protein, Serum Electrophoresis: 7.3 g/dL (ref 6.1–8.1)

## 2015-06-07 LAB — IMMUNOFIXATION ELECTROPHORESIS
IgA: 246 mg/dL (ref 68–379)
IgG (Immunoglobin G), Serum: 1200 mg/dL (ref 650–1600)
IgM, Serum: 83 mg/dL (ref 41–251)

## 2015-06-07 LAB — COPPER, SERUM: Copper: 82 ug/dL (ref 72–166)

## 2015-06-09 ENCOUNTER — Telehealth: Payer: Self-pay | Admitting: Neurology

## 2015-06-09 LAB — VITAMIN B1: Vitamin B1 (Thiamine): 15 nmol/L (ref 8–30)

## 2015-06-09 NOTE — Telephone Encounter (Signed)
See next note

## 2015-06-09 NOTE — Telephone Encounter (Signed)
PT called and would like a call back/Dawn CB# [phone]

## 2015-06-13 ENCOUNTER — Encounter: Payer: Self-pay | Admitting: Neurology

## 2015-06-13 ENCOUNTER — Encounter: Payer: Self-pay | Admitting: *Deleted

## 2015-06-13 ENCOUNTER — Other Ambulatory Visit: Payer: Self-pay | Admitting: *Deleted

## 2015-06-13 DIAGNOSIS — E0842 Diabetes mellitus due to underlying condition with diabetic polyneuropathy: Secondary | ICD-10-CM

## 2015-06-20 ENCOUNTER — Encounter: Payer: BC Managed Care – PPO | Admitting: Neurology

## 2015-06-26 ENCOUNTER — Encounter: Payer: Self-pay | Admitting: Internal Medicine

## 2015-06-26 ENCOUNTER — Encounter: Payer: Self-pay | Admitting: Neurology

## 2015-06-26 MED ORDER — METFORMIN HCL 500 MG PO TABS: 500.0000 mg | ORAL_TABLET | Freq: Two times a day (BID) | ORAL | Status: DC

## 2015-06-26 NOTE — Telephone Encounter (Signed)
Metformin Rx sent to [facility] pharmacy.

## 2015-07-07 ENCOUNTER — Ambulatory Visit
Admission: RE | Admit: 2015-07-07 | Discharge: 2015-07-07 | Disposition: A | Discharge status: Home / Self Care | Payer: BC Managed Care – PPO | Source: Ambulatory Visit | Attending: Neurology | Admitting: Neurology

## 2015-07-07 ENCOUNTER — Other Ambulatory Visit (HOSPITAL_COMMUNITY)
Admission: RE | Admit: 2015-07-07 | Discharge: 2015-07-07 | Disposition: A | Discharge status: Home / Self Care | Payer: BC Managed Care – PPO | Source: Ambulatory Visit | Attending: Neurology | Admitting: Neurology

## 2015-07-07 ENCOUNTER — Other Ambulatory Visit: Payer: Self-pay | Admitting: Neurology

## 2015-07-07 DIAGNOSIS — E0842 Diabetes mellitus due to underlying condition with diabetic polyneuropathy: Secondary | ICD-10-CM | POA: Diagnosis not present

## 2015-07-07 LAB — CSF CELL COUNT WITH DIFFERENTIAL
RBC Count, CSF: 0 cells/uL (ref 0–10)
WBC, CSF: 0 cells/uL (ref 0–5)

## 2015-07-07 LAB — PROTEIN, CSF: Total Protein, CSF: 44 mg/dL (ref 15–45)

## 2015-07-07 LAB — GLUCOSE, CSF: Glucose, CSF: 61 mg/dL (ref 43–76)

## 2015-07-07 NOTE — Discharge Instructions (Signed)

## 2015-07-07 NOTE — Progress Notes (Signed)
One SST tube of blood drawn from right AC space for LP labs; site unremarkable.  jkl 

## 2015-07-10 LAB — CNS IGG SYNTHESIS RATE, CSF+BLOOD
Albumin, CSF: 25.2 mg/dL (ref 8.0–42.0)
Albumin, Serum(Neph): 4.1 g/dL (ref 3.5–4.9)
IgG Index, CSF: 0.49 (ref <0.66)
IgG, CSF: 3.3 mg/dL (ref 0.8–7.7)
IgG, Serum: 1100 mg/dL (ref 694–1618)
MS CNS IgG Synthesis Rate: -2.7 mg/24 h (ref <=3.3)

## 2015-07-10 LAB — CSF CULTURE: Gram Stain: NONE SEEN

## 2015-07-10 LAB — CSF CULTURE W GRAM STAIN
Gram Stain: NONE SEEN
Organism ID, Bacteria: NO GROWTH

## 2015-07-10 LAB — ANGIOTENSIN CONVERTING ENZYME, CSF: ACE, CSF: 13 U/L (ref <=15)

## 2015-07-11 ENCOUNTER — Encounter: Payer: Self-pay | Admitting: Neurology

## 2015-07-12 ENCOUNTER — Other Ambulatory Visit: Payer: Self-pay | Admitting: *Deleted

## 2015-07-12 DIAGNOSIS — G971 Other reaction to spinal and lumbar puncture: Secondary | ICD-10-CM

## 2015-07-12 MED ORDER — BUTALBITAL-APAP-CAFFEINE 50-325-40 MG PO TABS: 1.0000 | ORAL_TABLET | Freq: Four times a day (QID) | ORAL | Status: DC | PRN

## 2015-07-13 LAB — OLIGOCLONAL BANDS, CSF + SERM

## 2015-07-19 ENCOUNTER — Ambulatory Visit: Payer: BC Managed Care – PPO | Admitting: Neurology

## 2015-07-19 ENCOUNTER — Encounter: Payer: Self-pay | Admitting: *Deleted

## 2015-07-31 ENCOUNTER — Encounter: Payer: Self-pay | Admitting: Neurology

## 2015-07-31 ENCOUNTER — Ambulatory Visit (INDEPENDENT_AMBULATORY_CARE_PROVIDER_SITE_OTHER): Payer: BC Managed Care – PPO | Admitting: Neurology

## 2015-07-31 VITALS — BP 110/80 | HR 90 | Ht 70.5 in | Wt 193.5 lb

## 2015-07-31 DIAGNOSIS — E0842 Diabetes mellitus due to underlying condition with diabetic polyneuropathy: Secondary | ICD-10-CM

## 2015-07-31 DIAGNOSIS — R253 Fasciculation: Secondary | ICD-10-CM

## 2015-07-31 NOTE — Progress Notes (Signed)
Follow-up Visit   Date: 07/31/2015   Samuel Rodriguez MRN: [id number] DOB: 24-Apr-1965   Interim History: Samuel Rodriguez is a 50 y.o. right-handed male with diabetes mellitus returning to the clinic for follow-up of neuropathy.  The patient was accompanied to the clinic by self.  History of present illness: Starting around 2014, he began having numbness and tight sensation involving his feet. He initially attributed this to his shoes, but then realized that despite changing his shoes, the symptoms are always present. Recently, he began noticing that he was unable to feel the pedals to his car as easily as before. He has numbness to the level of his ankles. In the evening, he has tingling sensation, as if there are bugs crawling on him. He denies any weakness or falls. He does endorse imbalance. He has noticed that temperature changes, exacerbates symptoms.   He reports to drinking heavily for 10 years (ages [age]), usually about a bottle of wine followed by 15 cans of beer nightly. Currently, drinks one glass of wine 3-5 times per week.  UPDATE 07/31/2015:  Patient underwent NCS/EMG which showed subacute sensorimotor polyneuropathy, because of the active findings and associated demyelinating changes, he underwent CSF testing which returned normal.  He feels that his symptoms are stable.  He continues to have numbness involving the feet, but the tight sensation is slightly improved.  No weakness or pain.  He still gets muscle twitches involving the legs and cramps which are worse in the morning.  No new complaints.    Medications:  Current Outpatient Prescriptions on File Prior to Visit  Medication Sig Dispense Refill  . butalbital-acetaminophen-caffeine (FIORICET, ESGIC) 50-325-40 MG tablet Take 1 tablet by mouth every 6 (six) hours as needed for headache. 20 tablet 0  . MAGNESIUM CITRATE PO Take by mouth.    . metFORMIN (GLUCOPHAGE) 500 MG tablet Take 1 tablet (500 mg total)  by mouth 2 (two) times daily with a meal. 60 tablet 5   No current facility-administered medications on file prior to visit.    Allergies: No Known Allergies  Review of Systems:  CONSTITUTIONAL: No fevers, chills, night sweats, or weight loss.  EYES: No visual changes or eye pain ENT: No hearing changes.  No history of nose bleeds.   RESPIRATORY: No cough, wheezing and shortness of breath.   CARDIOVASCULAR: Negative for chest pain, and palpitations.   GI: Negative for abdominal discomfort, blood in stools or black stools.  No recent change in bowel habits.   GU:  No history of incontinence.   MUSCLOSKELETAL: No history of joint pain or swelling.  No myalgias.   SKIN: Negative for lesions, rash, and itching.   ENDOCRINE: Negative for cold or heat intolerance, polydipsia or goiter.   PSYCH:  No depression or anxiety symptoms.   NEURO: As Above.   Vital Signs:  BP 110/80 mmHg  Pulse 90  Ht 5' 10.5" (1.791 m)  Wt 193 lb 8 oz (87.771 kg)  BMI 27.36 kg/m2  SpO2 98%  Neurological Exam: MENTAL STATUS including orientation to time, place, person, recent and remote memory, attention span and concentration, language, and fund of knowledge is normal.  Speech is not dysarthric.  CRANIAL NERVES:  PERRLA.  Extraocular muscles intact.  Hearing to finger rub intact. Face is symmetric.  Tongue is midline.  MOTOR:  Motor strength is 5/5 in all extremities.  Frequent fasciculations involving the TA bilaterally.    MSRs:  Reflexes are 2+/4 throughout, except absent Achilles  bilaterally  SENSORY:  Reduced vibration, temperature, and light touch distal to ankles bilaterally.Negative Romberg's testing.   COORDINATION/GAIT:  Normal finger-to- nose-finger. Gait narrow based and stable.   Data: MRI lumbar spine wo contrast 01/23/2014: Focal LEFT paracentral extrusion at L3-4. LEFT L4 nerve root compression. Central and leftward protrusion at L4-5 extending to the foramen. LEFT L4 and LEFT L5 nerve  root impingement are likely.  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 Overall, these findings are severe in degree electrically.  CSF 07/07/2015:  W0 R0 G61 P44, ACE 13, no OCB IgG index 0.49  Lab Results  Component Value Date   HGBA1C 6.3* 05/12/2015    IMPRESSION/PLAN: 1.  Subacute sensorimotor polyneuropathy, demyelinating-slowing and axon loss - ?whether it can all be attributed to diabetes  - Clinically stable with only distal sensory loss, no motor deficits  - Normal CSF  - Normal ESR, SSA/B, copper, SPEP with IFE, vitamin B1, TSH, vitamin B12  - Follow clinically, may need to repeat NCS/EMG if symptoms worsen  - Continue diabetes management and lifestyle modification  2.  Benign fasciculations could be related to foraminal stenosis since he has both L4 and L5 nerve impingement previously seen on MRI  - Repeat MRI lumbar spine if he develops new weakness or pain  Return to clinic in 4 months   The duration of this appointment visit was 30 minutes of face-to-face time with the patient.  Greater than 50% of this time was spent in counseling, explanation of diagnosis, planning of further management, and coordination of care.   Thank you for allowing me to participate in patient's care.  If I can answer any additional questions, I would be pleased to do so.    Sincerely,    Donika K. Posey Pronto, DO

## 2015-08-11 ENCOUNTER — Encounter: Payer: Self-pay | Admitting: Internal Medicine

## 2015-08-11 ENCOUNTER — Ambulatory Visit (INDEPENDENT_AMBULATORY_CARE_PROVIDER_SITE_OTHER): Payer: BC Managed Care – PPO | Admitting: Internal Medicine

## 2015-08-11 VITALS — BP 118/74 | HR 71 | Temp 98.0°F | Ht 71.0 in | Wt 188.1 lb

## 2015-08-11 DIAGNOSIS — Z0100 Encounter for examination of eyes and vision without abnormal findings: Secondary | ICD-10-CM | POA: Diagnosis not present

## 2015-08-11 DIAGNOSIS — Z23 Encounter for immunization: Secondary | ICD-10-CM

## 2015-08-11 DIAGNOSIS — E118 Type 2 diabetes mellitus with unspecified complications: Secondary | ICD-10-CM | POA: Diagnosis not present

## 2015-08-11 DIAGNOSIS — R7989 Other specified abnormal findings of blood chemistry: Secondary | ICD-10-CM | POA: Diagnosis not present

## 2015-08-11 DIAGNOSIS — G629 Polyneuropathy, unspecified: Secondary | ICD-10-CM

## 2015-08-11 DIAGNOSIS — E119 Type 2 diabetes mellitus without complications: Secondary | ICD-10-CM | POA: Diagnosis not present

## 2015-08-11 DIAGNOSIS — R945 Abnormal results of liver function studies: Secondary | ICD-10-CM

## 2015-08-11 LAB — BASIC METABOLIC PANEL
BUN: 18 mg/dL (ref 6–23)
CO2: 32 mEq/L (ref 19–32)
Calcium: 9.6 mg/dL (ref 8.4–10.5)
Chloride: 103 mEq/L (ref 96–112)
Creatinine, Ser: 0.86 mg/dL (ref 0.40–1.50)
GFR: 99.33 mL/min (ref >60.00)
Glucose, Bld: 110 mg/dL — ABNORMAL HIGH (ref 70–99)
Potassium: 4.5 mEq/L (ref 3.5–5.1)
Sodium: 139 mEq/L (ref 135–145)

## 2015-08-11 LAB — LIPID PANEL
Cholesterol: 184 mg/dL (ref 0–200)
HDL: 44.8 mg/dL (ref >39.00)
LDL Cholesterol: 118 mg/dL — ABNORMAL HIGH (ref 0–99)
NonHDL: 139.5
Total CHOL/HDL Ratio: 4
Triglycerides: 108 mg/dL (ref 0.0–149.0)
VLDL: 21.6 mg/dL (ref 0.0–40.0)

## 2015-08-11 LAB — MICROALBUMIN / CREATININE URINE RATIO
Creatinine,U: 207.4 mg/dL
Microalb Creat Ratio: 0.3 mg/g (ref 0.0–30.0)
Microalb, Ur: <0.7 mg/dL (ref 0.0–1.9)

## 2015-08-11 LAB — AST: AST: 37 U/L (ref 0–37)

## 2015-08-11 LAB — HEMOGLOBIN A1C: Hgb A1c MFr Bld: 6 % (ref 4.6–6.5)

## 2015-08-11 LAB — ALT: ALT: 49 U/L (ref 0–53)

## 2015-08-11 MED ORDER — GLUCOSE BLOOD VI STRP: ORAL_STRIP | Status: DC

## 2015-08-11 MED ORDER — TETANUS-DIPHTH-ACELL PERTUSSIS 5-2.5-18.5 LF-MCG/0.5 IM SUSP
0.5000 mL | Freq: Once | INTRAMUSCULAR | Status: AC
  Administered 2015-08-11: 0.5 mL via INTRAMUSCULAR

## 2015-08-11 MED ORDER — METFORMIN HCL 500 MG PO TABS: 500.0000 mg | ORAL_TABLET | Freq: Two times a day (BID) | ORAL | Status: DC

## 2015-08-11 MED ORDER — PNEUMOCOCCAL VAC POLYVALENT 25 MCG/0.5ML IJ INJ: 0.5000 mL | INJECTION | Freq: Once | INTRAMUSCULAR | Status: DC

## 2015-08-11 MED ORDER — ONETOUCH ULTRASOFT LANCETS MISC: Status: DC

## 2015-08-11 NOTE — Progress Notes (Signed)
Pre visit review using our clinic review tool, if applicable. No additional management support is needed unless otherwise documented below in the visit note. 

## 2015-08-11 NOTE — Patient Instructions (Addendum)
GO TO THE LAB : Get the blood work     GO TO THE FRONT DESK Schedule your next appointment for a  Physical in 3-4 months , fasting   Diabetes: Check your blood sugar  once a day   at different times of the day  GOALS: Fasting before a meal 70- 130 2 hours after a meal less than 180 At bedtime 90-150 Call if consistently not at goal ----   If you need more information about a healthy diet, heart disease, diabetes, hypertension visit: The American Heart Association, http://www.heart.org  The American diabetes Association  Http://www.diabetes.org   All about diabetes, visit [facility] website it is a great resource http://www.[facility].org

## 2015-08-11 NOTE — Progress Notes (Signed)
Subjective:    Patient ID: Samuel Rodriguez, male    DOB: 1966/02/03, 50 y.o.   MRN: [id number]  DOS:  08/11/2015 Type of visit - description : Routine checkup Interval history: Since the last time he was here, was diagnosed with neuropathy, extensive workup essentially negative except for a A1c of 6.3. Symptoms suspected to be due to diabetes. Chart reviewed and summarized below Complaining of chronic pain at the right suprapubic area without mass  Review of Systems Denies chest pain or difficulty breathing No nausea, vomiting, diarrhea.  Past Medical History  Diagnosis Date  . History of chicken pox   . Chronic back pain     Dr. Saintclair Halsted, "chipped disc"  . Diabetes mellitus [facility])     Past Surgical History  Procedure Laterality Date  . No past surgeries      Social History   Social History  . Marital Status: Single    Spouse Name: N/A  . Number of Children: 0  . Years of Education: N/A   Occupational History  . school teacher    Social History Main Topics  . Smoking status: Never Smoker   . Smokeless tobacco: Never Used  . Alcohol Use: 0.0 oz/week    0 Standard drinks or equivalent per week     Comment: Glass of wine 4-5 times per week.  Heavy drinking for 10 years (ages [age])  . Drug Use: No  . Sexual Activity: Not on file   Other Topics Concern  . Not on file   Social History Narrative   Lives by himself in a condominium.  No children.     Works as a Pharmacist, hospital.  Education: Masters   From  [place]         Medication List       This list is accurate as of: 08/11/15 11:59 PM.  Always use your most recent med list.               butalbital-acetaminophen-caffeine 50-325-40 MG tablet  Commonly known as:  FIORICET, ESGIC  Take 1 tablet by mouth every 6 (six) hours as needed for headache.     glucose blood test strip  Check blood sugar no more than twice daily     MAGNESIUM CITRATE PO  Take by mouth.     metFORMIN 500 MG tablet  Commonly known  as:  GLUCOPHAGE  Take 1 tablet (500 mg total) by mouth 2 (two) times daily with a meal.     onetouch ultrasoft lancets  Check blood sugar no more than twice daily.     pneumococcal 23 valent vaccine 25 MCG/0.5ML injection  Commonly known as:  PNEUMOVAX 23  Inject 0.5 mLs into the muscle once.           Objective:   Physical Exam BP 118/74 mmHg  Pulse 71  Temp(Src) 98 F (36.7 C) (Oral)  Ht _0  (1.803 m)  Wt 188 lb 2 oz (85.333 kg)  BMI 26.25 kg/m2  SpO2 97% General:   Well developed, well nourished . NAD.  HEENT:  Normocephalic . Face symmetric, atraumatic Lungs:  CTA B Normal respiratory effort, no intercostal retractions, no accessory muscle use. Heart: RRR,  no murmur.  no pretibial edema bilaterally  Abdomen:  Not distended, soft, non-tender. No rebound or rigidity.  Groin, suprapubic area: No evidence of hernias Skin: Not pale. Not jaundice Neurologic:  alert & oriented X3.  Speech normal, gait appropriate for age and unassisted Psych--  Cognition  and judgment appear intact.  Cooperative with normal attention span and concentration.  Behavior appropriate. No anxious or depressed appearing.     Assessment & Plan:    Assessment: DM A1C 6.3 (04-2015) Subacute sensorimotor polyneuropathy, demyelinating-slowing and axon loss?  Likely d/t DM.  W/U 2017:- Normal CSF. Normal ESR, SSA/B, copper, SPEP with IFE, vitamin B1, TSH, vitamin B12.  Follow clinically, may need to repeat NCS/EMG if symptoms worsen Benign fasciculations  Neuro 2017: could be related to foraminal stenosis since he has both L4 and L5 nerve impingement previously seen on MRI. Repeat MRI lumbar spine if he develops new weakness or pain  Back pain , 11-2013, MRI, saw Dr Saintclair Halsted, Left L3-L4 problem, was rx surgery, only did PT, strength returned w/ PT   PLAN DM: Recently dx w/ DM, A1c of 6.3, has  complications (polyneuropathy), started metformin 3 months ago, good tolerance. Has definitely  improved his diet, exercising more, has lost some weight. We'll set a goal of A1c < 6.0 Check labs: BMP, micro-, A1c, FLP Extensive discussion about diet, exercise, concept both A1c. Glucometer provided, CBG goals discussed. Refer to ophthalmology. Neuropathy: Chart reviewed, extensive workup essentially negative, neuropathy suspected to be from diabetes. Increase LFTs: Recheck in today Chronic pain, right inguinal area: Exam negative for hernias, recommend observation Primary care: Td, pneumonia shot. RTC 4 months, CPX Time spent >> 25

## 2015-08-13 NOTE — Assessment & Plan Note (Signed)
DM: Recently dx w/ DM, A1c of 6.3, has  complications (polyneuropathy), started metformin 3 months ago, good tolerance. Has definitely improved his diet, exercising more, has lost some weight. We'll set a goal of A1c < 6.0 Check labs: BMP, micro-, A1c, FLP Extensive discussion about diet, exercise, concept both A1c. Glucometer provided, CBG goals discussed. Refer to ophthalmology. Neuropathy: Chart reviewed, extensive workup essentially negative, neuropathy suspected to be from diabetes. Increase LFTs: Recheck in today Chronic pain, right inguinal area: Exam negative for hernias, recommend observation Primary care: Td, pneumonia shot. RTC 4 months, CPX

## 2015-08-15 LAB — HM DIABETES EYE EXAM

## 2015-08-17 ENCOUNTER — Encounter: Payer: Self-pay | Admitting: Internal Medicine

## 2015-09-01 ENCOUNTER — Encounter: Payer: Self-pay | Admitting: Internal Medicine

## 2015-10-13 ENCOUNTER — Encounter: Payer: BC Managed Care – PPO | Admitting: Internal Medicine

## 2015-11-09 ENCOUNTER — Other Ambulatory Visit: Payer: Self-pay

## 2015-11-10 ENCOUNTER — Ambulatory Visit (INDEPENDENT_AMBULATORY_CARE_PROVIDER_SITE_OTHER): Payer: BC Managed Care – PPO | Admitting: Internal Medicine

## 2015-11-10 ENCOUNTER — Encounter: Payer: Self-pay | Admitting: Internal Medicine

## 2015-11-10 ENCOUNTER — Ambulatory Visit (HOSPITAL_BASED_OUTPATIENT_CLINIC_OR_DEPARTMENT_OTHER)
Admission: RE | Admit: 2015-11-10 | Discharge: 2015-11-10 | Disposition: A | Discharge status: Home / Self Care | Payer: BC Managed Care – PPO | Source: Ambulatory Visit | Attending: Internal Medicine | Admitting: Internal Medicine

## 2015-11-10 VITALS — BP 118/74 | HR 78 | Temp 97.9°F | Resp 14 | Ht 71.0 in | Wt 187.0 lb

## 2015-11-10 DIAGNOSIS — R05 Cough: Secondary | ICD-10-CM | POA: Diagnosis not present

## 2015-11-10 DIAGNOSIS — E119 Type 2 diabetes mellitus without complications: Secondary | ICD-10-CM

## 2015-11-10 DIAGNOSIS — G629 Polyneuropathy, unspecified: Secondary | ICD-10-CM

## 2015-11-10 DIAGNOSIS — K111 Hypertrophy of salivary gland: Secondary | ICD-10-CM

## 2015-11-10 DIAGNOSIS — R059 Cough, unspecified: Secondary | ICD-10-CM

## 2015-11-10 LAB — HEMOGLOBIN A1C: Hgb A1c MFr Bld: 5.9 % (ref 4.6–6.5)

## 2015-11-10 LAB — ALT: ALT: 45 U/L (ref 0–53)

## 2015-11-10 LAB — AST: AST: 38 U/L — ABNORMAL HIGH (ref 0–37)

## 2015-11-10 MED ORDER — AZELASTINE HCL 0.1 % NA SOLN: 2.0000 | Freq: Every day | NASAL | 11 refills | Status: DC

## 2015-11-10 NOTE — Assessment & Plan Note (Signed)
DM: Diet was not the best during the summer but is back on track, on metformin, check A1c, AST, ALT Neuropathy: Ongoing sx, neuropathy is not painful. A couple of times was unable to feel the breaks in his car, I suspect may need a car modification. Patient concerned about prognosis of neuropathy. Encouraged to talk with neurology. Chronic cough: Cough year round, ROS positive for allergies. Treat allergies with Astelin and Flonase. Likes further eval: check a chest x-ray. URI: Sx started 3 days ago, had fever the first 2 days, exam is benign. Recommend conservative treatment Enlarged parotid: Patient thinks he has a "gland" at the left neck, exam is C/W  enlarged parotid. Recommend observation versus ENT, patient is quite concerned and like to have a ENT opinion. Referral done. RTC 4-5 months

## 2015-11-10 NOTE — Patient Instructions (Signed)
GO TO THE LAB : Get the blood work     GO TO THE FRONT DESK Schedule your next appointment for a  Check up in 4-5 months    STOP BY THE FIRST FLOOR:  get the XR   For the cold: Rest, fluids , tylenol Take Mucinex DM twice a day as needed until better Call if not gradually better over the next  10 days Call anytime if the symptoms are severe    For ALLERGIES  Use OTC   Flonase : 2 nasal sprays on each side of the nose in the morning  Use ASTELIN a prescribed spray : 2 nasal sprays on each side of the nose at night

## 2015-11-10 NOTE — Progress Notes (Signed)
Subjective:    Patient ID: Samuel Rodriguez, male    DOB: 1965/07/04, 50 y.o.   MRN: [id number]  DOS:  11/10/2015 Type of visit - description : rov, Multiple concerns Interval history: DM: Good compliance of medication, ambulatory blood sugars preprandially 120. Neuropathy: Continue to be an issue, a couple of times had a difficult time feeling the breaks in his car. Cough, chronic, for years, would like something done. Cough is dry, day or night. Also developed a URI 3 days ago with increased cough from baseline, nasal congestion, fever for the first 2 days. Also c/o a enlarged gland on the left side of the neck x  years, not changing lately. No pain.   Review of Systems  admits to itchy eyes, nose and sneezing on and off throughout the year. + Postnasal dripping No GERD No history of asthma or wheezing.   Past Medical History:  Diagnosis Date  . Chronic back pain    Dr. Saintclair Halsted, "chipped disc"  . Diabetes mellitus ([place])   . History of chicken pox     Past Surgical History:  Procedure Laterality Date  . NO PAST SURGERIES      Social History   Social History  . Marital status: Single    Spouse name: N/A  . Number of children: 0  . Years of education: N/A   Occupational History  . school teacher    Social History Main Topics  . Smoking status: Never Smoker  . Smokeless tobacco: Never Used  . Alcohol use 0.0 oz/week     Comment: Glass of wine 4-5 times per week.  Heavy drinking for 10 years (ages [age])  . Drug use: No  . Sexual activity: Not on file   Other Topics Concern  . Not on file   Social History Narrative   Lives by himself in a condominium.  No children.     Works as a Pharmacist, hospital.  Education: Masters   From  [place]         Medication List       Accurate as of 11/10/15  9:29 AM. Always use your most recent med list.          butalbital-acetaminophen-caffeine 50-325-40 MG tablet Commonly known as:  FIORICET, ESGIC Take 1 tablet by mouth  every 6 (six) hours as needed for headache.   glucose blood test strip Check blood sugar no more than twice daily   MAGNESIUM CITRATE PO Take by mouth.   metFORMIN 500 MG tablet Commonly known as:  GLUCOPHAGE Take 1 tablet (500 mg total) by mouth 2 (two) times daily with a meal.   onetouch ultrasoft lancets Check blood sugar no more than twice daily.          Objective:   Physical Exam  HENT:  Head:     BP 118/74 (BP Location: Left Arm, Patient Position: Sitting, Cuff Size: Normal)   Pulse 78   Temp 97.9 F (36.6 C) (Oral)   Resp 14   Ht _0  (1.803 m)   Wt 187 lb (84.8 kg)   SpO2 97%   BMI 26.08 kg/m  General:   Well developed, well nourished . NAD.  HEENT:  Normocephalic . Face symmetric, atraumatic. TMs normal, nose is slightly congested, throat symmetric and not red. Neck: Area of the patient's concerns for a enlarged  gland is consistent with a slightly enlarged parotid. See graphic. Lungs:  CTA B Normal respiratory effort, no intercostal retractions, no accessory muscle  use. Heart: RRR,  no murmur.  No pretibial edema bilaterally  Skin: Not pale. Not jaundice Neurologic:  alert & oriented X3.  Speech normal, gait appropriate for age and unassisted Psych--  Cognition and judgment appear intact.  Cooperative with normal attention span and concentration.  Behavior appropriate. No anxious or depressed appearing.      Assessment & Plan:   Assessment: DM A1C 6.3 (04-2015) Subacute sensorimotor polyneuropathy, demyelinating-slowing and axon loss?  Likely d/t DM.  W/U 2017:- Normal CSF. Normal ESR, SSA/B, copper, SPEP with IFE, vitamin B1, TSH, vitamin B12.  Follow clinically, may need to repeat NCS/EMG if symptoms worsen Benign fasciculations  Neuro 2017: could be related to foraminal stenosis since he has both L4 and L5 nerve impingement previously seen on MRI. Repeat MRI lumbar spine if he develops new weakness or pain Back pain , 11-2013, MRI,  saw Dr Saintclair Halsted, Left L3-L4 problem, was rx surgery, only did PT, strength returned w/ PT   PLAN DM: Diet was not the best during the summer but is back on track, on metformin, check A1c, AST, ALT Neuropathy: Ongoing sx, neuropathy is not painful. A couple of times was unable to feel the breaks in his car, I suspect may need a car modification. Patient concerned about prognosis of neuropathy. Encouraged to talk with neurology. Chronic cough: Cough year round, ROS positive for allergies. Treat allergies with Astelin and Flonase. Likes further eval: check a chest x-ray. URI: Sx started 3 days ago, had fever the first 2 days, exam is benign. Recommend conservative treatment Enlarged parotid: Patient thinks he has a "gland" at the left neck, exam is C/W  enlarged parotid. Recommend observation versus ENT, patient is quite concerned and like to have a ENT opinion. Referral done. RTC 4-5 months

## 2015-11-10 NOTE — Progress Notes (Signed)
Pre visit review using our clinic review tool, if applicable. No additional management support is needed unless otherwise documented below in the visit note. 

## 2015-11-11 ENCOUNTER — Encounter: Payer: Self-pay | Admitting: Internal Medicine

## 2015-11-11 DIAGNOSIS — R55 Syncope and collapse: Secondary | ICD-10-CM

## 2015-11-13 NOTE — Telephone Encounter (Signed)
enter a cardiology referral, syncope  Received: Today  Message Contents   Wanda PlumpJose E Paz, MD  Conrad BurlingtonKaylyn Rabecca Birge, CMA   Referral placed.

## 2015-11-24 ENCOUNTER — Encounter: Payer: Self-pay | Admitting: Internal Medicine

## 2015-11-27 NOTE — Telephone Encounter (Signed)
Would one of you guys check the status of the referral and get back to the patient.     KP

## 2015-11-28 ENCOUNTER — Telehealth: Payer: Self-pay | Admitting: Internal Medicine

## 2015-11-28 NOTE — Telephone Encounter (Signed)
LVM for pt to return call to inform him about his referral to Ear and Nose doctor (on chart his appt)

## 2015-11-29 NOTE — Telephone Encounter (Signed)
Spoke with pt and pt already had appt info.

## 2015-12-08 ENCOUNTER — Ambulatory Visit: Payer: BC Managed Care – PPO | Admitting: Neurology

## 2015-12-12 ENCOUNTER — Other Ambulatory Visit: Payer: Self-pay | Admitting: Otolaryngology

## 2015-12-12 DIAGNOSIS — R609 Edema, unspecified: Secondary | ICD-10-CM
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
## 2015-12-29 ENCOUNTER — Encounter: Payer: Self-pay | Admitting: Cardiology

## 2015-12-29 ENCOUNTER — Ambulatory Visit (INDEPENDENT_AMBULATORY_CARE_PROVIDER_SITE_OTHER): Payer: BC Managed Care – PPO | Admitting: Cardiology

## 2015-12-29 VITALS — BP 132/80 | HR 98 | Ht 71.0 in | Wt 188.0 lb

## 2015-12-29 DIAGNOSIS — R55 Syncope and collapse: Secondary | ICD-10-CM | POA: Diagnosis not present

## 2015-12-29 NOTE — Patient Instructions (Signed)
Medication Instructions:   NO CHANGE  Testing/Procedures:  Your physician has requested that you have an echocardiogram. Echocardiography is a painless test that uses sound waves to create images of your heart. It provides your doctor with information about the size and shape of your heart and how well your heart's chambers and valves are working. This procedure takes approximately one hour. There are no restrictions for this procedure.    Follow-Up:  Your physician wants you to follow-up in: 6 MONTHS WITH DR CRENSHAW You will receive a reminder letter in the mail two months in advance. If you don't receive a letter, please call our office to schedule the follow-up appointment.      

## 2016-01-05 ENCOUNTER — Other Ambulatory Visit: Payer: BC Managed Care – PPO

## 2016-01-17 ENCOUNTER — Encounter: Payer: Self-pay | Admitting: Cardiology

## 2016-01-19 ENCOUNTER — Inpatient Hospital Stay: Admission: RE | Admit: 2016-01-19 | Payer: BC Managed Care – PPO | Source: Ambulatory Visit

## 2016-01-26 ENCOUNTER — Other Ambulatory Visit (HOSPITAL_COMMUNITY): Payer: BC Managed Care – PPO

## 2016-02-14 ENCOUNTER — Other Ambulatory Visit: Payer: Self-pay | Admitting: Internal Medicine

## 2016-03-15 ENCOUNTER — Ambulatory Visit: Payer: BC Managed Care – PPO | Admitting: Internal Medicine

## 2016-03-21 ENCOUNTER — Other Ambulatory Visit: Payer: Self-pay | Admitting: Otolaryngology

## 2016-03-22 ENCOUNTER — Other Ambulatory Visit: Payer: BC Managed Care – PPO

## 2016-03-29 ENCOUNTER — Other Ambulatory Visit: Payer: BC Managed Care – PPO

## 2016-04-05 ENCOUNTER — Encounter: Payer: Self-pay | Admitting: Internal Medicine

## 2016-04-05 ENCOUNTER — Ambulatory Visit (INDEPENDENT_AMBULATORY_CARE_PROVIDER_SITE_OTHER): Payer: BC Managed Care – PPO | Admitting: Internal Medicine

## 2016-04-05 VITALS — BP 126/76 | HR 65 | Temp 98.0°F | Resp 14 | Ht 71.0 in | Wt 188.1 lb

## 2016-04-05 DIAGNOSIS — E118 Type 2 diabetes mellitus with unspecified complications: Secondary | ICD-10-CM

## 2016-04-05 DIAGNOSIS — Z1211 Encounter for screening for malignant neoplasm of colon: Secondary | ICD-10-CM

## 2016-04-05 DIAGNOSIS — Z1159 Encounter for screening for other viral diseases: Secondary | ICD-10-CM

## 2016-04-05 LAB — HEMOGLOBIN A1C: Hgb A1c MFr Bld: 6 % (ref 4.6–6.5)

## 2016-04-05 LAB — COMPREHENSIVE METABOLIC PANEL
ALT: 50 U/L (ref 0–53)
AST: 38 U/L — ABNORMAL HIGH (ref 0–37)
Albumin: 4.4 g/dL (ref 3.5–5.2)
Alkaline Phosphatase: 54 U/L (ref 39–117)
BUN: 17 mg/dL (ref 6–23)
CO2: 30 mEq/L (ref 19–32)
Calcium: 9.2 mg/dL (ref 8.4–10.5)
Chloride: 103 mEq/L (ref 96–112)
Creatinine, Ser: 0.97 mg/dL (ref 0.40–1.50)
GFR: 86.23 mL/min (ref >60.00)
Glucose, Bld: 108 mg/dL — ABNORMAL HIGH (ref 70–99)
Potassium: 3.8 mEq/L (ref 3.5–5.1)
Sodium: 139 mEq/L (ref 135–145)
Total Bilirubin: 0.7 mg/dL (ref 0.2–1.2)
Total Protein: 7.3 g/dL (ref 6.0–8.3)

## 2016-04-05 LAB — HEPATITIS C ANTIBODY: HCV Ab: NEGATIVE

## 2016-04-05 NOTE — Progress Notes (Signed)
Subjective:    Patient ID: Samuel Rodriguez, male    DOB: Dec 09, 1965, 51 y.o.   MRN: [id number]  DOS:  04/05/2016 Type of visit - description : rov Interval history: DM: Good med compliance,  no apparent side effects. Lifestyle continued to be very good. Allergies: Not an issue recently, on  Astelin as needed Occasionally fatigue in the afternoon, CBGs when checked normal, never below 80. Denies chest pain or difficulty breathing No major problems with stress   Review of Systems Neuropathy symptoms at baseline, worse on the right  Past Medical History:  Diagnosis Date  . Chronic back pain    Dr. Saintclair Halsted, "chipped disc"  . Diabetes mellitus ([place])   . History of chicken pox     Past Surgical History:  Procedure Laterality Date  . NO PAST SURGERIES      Social History   Social History  . Marital status: Single    Spouse name: N/A  . Number of children: 0  . Years of education: N/A   Occupational History  . school teacher    Social History Main Topics  . Smoking status: Never Smoker  . Smokeless tobacco: Never Used  . Alcohol use 0.0 oz/week     Comment: Glass of wine 4-5 times per week.  Heavy drinking for 10 years (ages [age])  . Drug use: No  . Sexual activity: Not on file   Other Topics Concern  . Not on file   Social History Narrative   Lives by himself in a condominium.  No children.     Works as a Pharmacist, hospital.  Education: Masters   From  [place]       Allergies as of 04/05/2016   No Known Allergies     Medication List       Accurate as of 04/05/16 11:59 PM. Always use your most recent med list.          azelastine 0.1 % nasal spray Commonly known as:  ASTELIN Place 2 sprays into both nostrils at bedtime. Use in each nostril as directed   glucose blood test strip Check blood sugar no more than twice daily   MAGNESIUM CITRATE PO Take by mouth.   metFORMIN 500 MG tablet Commonly known as:  GLUCOPHAGE Take 1 tablet (500 mg total) by mouth  2 (two) times daily with a meal.   multivitamin tablet Take 1 tablet by mouth daily.   onetouch ultrasoft lancets Check blood sugar no more than twice daily.          Objective:   Physical Exam BP 126/76 (BP Location: Left Arm, Patient Position: Sitting, Cuff Size: Normal)   Pulse 65   Temp 98 F (36.7 C) (Oral)   Resp 14   Ht _0  (1.803 m)   Wt 188 lb 2 oz (85.3 kg)   SpO2 98%   BMI 26.24 kg/m  General:   Well developed, well nourished . NAD.  HEENT:  Normocephalic . Face symmetric, atraumatic Lungs:  CTA B Normal respiratory effort, no intercostal retractions, no accessory muscle use. Heart: RRR,  no murmur.  No pretibial edema bilaterally  DIABETIC FEET EXAM: No lower extremity edema Normal pedal pulses bilaterally Skin normal, nails normal, no calluses Pinprick examination : Patchy decrease throughout the feet, worse on the right. Neurologic:  alert & oriented X3.  Speech normal, gait appropriate for age and unassisted Psych--  Cognition and judgment appear intact.  Cooperative with normal attention span and concentration.  Behavior appropriate. No anxious or depressed appearing.      Assessment & Plan:   Assessment: DM A1C 6.3 (04-2015) Subacute sensorimotor polyneuropathy, demyelinating-slowing and axon loss?  Likely d/t DM.  W/U 2017:- Normal CSF. Normal ESR, SSA/B, copper, SPEP with IFE, vitamin B1, TSH, vitamin B12.  Follow clinically, may need to repeat NCS/EMG if symptoms worsen Benign fasciculations  Neuro 2017: could be related to foraminal stenosis since he has both L4 and L5 nerve impingement previously seen on MRI. Repeat MRI lumbar spine if he develops new weakness or pain Back pain  11-2013, MRI, saw Dr Saintclair Halsted, Left L3-L4 problem, was rx surgery, only did PT, strength returned w/ PT   PLAN DM: Continue metformin, has neuropathy, feet care discussed. Allergies: On Astelin as needed Enlarged parotid: See last OV, saw ENT. Primary care:  Discuss colon cancer screening, he agreed for a GI referral. Check for hep C. Had a flu shot-2017 RTC 6 months

## 2016-04-05 NOTE — Progress Notes (Signed)
Pre visit review using our clinic review tool, if applicable. No additional management support is needed unless otherwise documented below in the visit note. 

## 2016-04-05 NOTE — Patient Instructions (Signed)
GO TO THE LAB : Get the blood work     GO TO THE FRONT DESK Schedule your next appointment for a  Physical in 6 months, fasting 

## 2016-04-06 NOTE — Assessment & Plan Note (Signed)
DM: Continue metformin, has neuropathy, feet care discussed. Allergies: On Astelin as needed Enlarged parotid: See last OV, saw ENT. Primary care: Discuss colon cancer screening, he agreed for a GI referral. Check for hep C. Had a flu shot-2017 RTC 6 months

## 2016-04-12 ENCOUNTER — Other Ambulatory Visit (HOSPITAL_COMMUNITY): Payer: BC Managed Care – PPO

## 2016-05-03 ENCOUNTER — Encounter: Payer: Self-pay | Admitting: Internal Medicine

## 2016-06-03 ENCOUNTER — Encounter (HOSPITAL_COMMUNITY): Payer: Self-pay | Admitting: Cardiology

## 2016-06-03 ENCOUNTER — Telehealth (HOSPITAL_COMMUNITY): Payer: Self-pay | Admitting: Cardiology

## 2016-06-03 DIAGNOSIS — R509 Fever, unspecified: Secondary | ICD-10-CM

## 2016-06-03 NOTE — Telephone Encounter (Signed)
Patient has cancelled appts on 01/26/16 and 04/12/16. He will receive a letter in the mail to [facility] if he wants to reschedule this appt.

## 2016-06-04 ENCOUNTER — Ambulatory Visit (INDEPENDENT_AMBULATORY_CARE_PROVIDER_SITE_OTHER): Payer: BC Managed Care – PPO | Admitting: Medical

## 2016-06-04 VITALS — BP 133/89 | HR 86 | Temp 98.3°F | Resp 16 | Ht 71.0 in | Wt 198.8 lb

## 2016-06-04 DIAGNOSIS — R0789 Other chest pain: Secondary | ICD-10-CM

## 2016-06-04 DIAGNOSIS — J301 Allergic rhinitis due to pollen: Secondary | ICD-10-CM | POA: Diagnosis not present

## 2016-06-04 DIAGNOSIS — J0100 Acute maxillary sinusitis, unspecified: Secondary | ICD-10-CM

## 2016-06-04 DIAGNOSIS — R509 Fever, unspecified: Secondary | ICD-10-CM

## 2016-06-04 LAB — CBC WITH DIFFERENTIAL/PLATELET
Basophils Absolute: 0 10*3/uL (ref 0.0–0.1)
Basophils Relative: 0.3 % (ref 0.0–3.0)
Eosinophils Absolute: 0 10*3/uL (ref 0.0–0.7)
Eosinophils Relative: 0.6 % (ref 0.0–5.0)
HCT: 47.9 % (ref 39.0–52.0)
Hemoglobin: 16.2 g/dL (ref 13.0–17.0)
Lymphocytes Relative: 58.9 % — ABNORMAL HIGH (ref 12.0–46.0)
Lymphs Abs: 2.7 10*3/uL (ref 0.7–4.0)
MCHC: 33.9 g/dL (ref 30.0–36.0)
MCV: 89 fl (ref 78.0–100.0)
Monocytes Absolute: 0.6 10*3/uL (ref 0.1–1.0)
Monocytes Relative: 13.2 % — ABNORMAL HIGH (ref 3.0–12.0)
Neutro Abs: 1.2 10*3/uL — ABNORMAL LOW (ref 1.4–7.7)
Neutrophils Relative %: 27 % — ABNORMAL LOW (ref 43.0–77.0)
Platelets: 216 10*3/uL (ref 150.0–400.0)
RBC: 5.39 Mil/uL (ref 4.22–5.81)
RDW: 13.4 % (ref 11.5–15.5)
WBC: 4.5 10*3/uL (ref 4.0–10.5)

## 2016-06-04 MED ORDER — DOXYCYCLINE HYCLATE 100 MG PO TABS
100.0000 mg | ORAL_TABLET | Freq: Two times a day (BID) | ORAL | 0 refills | Status: DC
Start: 2016-06-04 — End: 2016-10-09

## 2016-06-04 MED ORDER — LEVOCETIRIZINE DIHYDROCHLORIDE 5 MG PO TABS: 5.0000 mg | ORAL_TABLET | Freq: Every evening | ORAL | 0 refills | Status: DC

## 2016-06-04 NOTE — Progress Notes (Signed)
Subjective:    Patient ID: Samuel Rodriguez, male    DOB: April 10, 1965, 51 y.o.   MRN: [id number]  HPI  Pt in having moderate nasal congestion with sinus pressure. Pt not blowing out mucous from his nose. No ear pain. No cough. Pt has some wheezing very minimal. Years past he had worse wheezing but he never used an inhaler. Pt states he uses flonase. He has also added astelin recently.    Pt states he has chest pain. Pain worse when he breaths. He puts hand over area of pain. Rt lower pectoralis area and left upper pectoral area pain. He states feel like muscle pain. Also pain when he drinks water. No report of gerd.At rest no pain in chest. No jaw pain and no arm pain. No diaphoresis.  Also he recently some left groin area pain that last Friday to Sunday. He states pain in past lke this and he thinks related to stress. Dr. Drue Novel in past checked him and thought no hernia. No diarrhea reported. Pain now resolved.   Pt also reports some abdomen pain.    Review of Systems  Constitutional: Positive for fever. Negative for appetite change, chills and fatigue.       Fever last night 101. Last 4 days at night felt fever.  HENT: Positive for congestion, sinus pain and sinus pressure. Negative for ear discharge, ear pain, facial swelling, mouth sores, nosebleeds, postnasal drip, sore throat and tinnitus.   Respiratory: Negative for cough, chest tightness and wheezing.        Pain on breathing. Past week but not now.  Cardiovascular: Negative for chest pain and palpitations.  Gastrointestinal: Negative for abdominal pain, blood in stool, constipation, diarrhea, nausea, rectal pain and vomiting.       Left groin area pain history of. Not presently.  Genitourinary: Negative for difficulty urinating, discharge, enuresis, flank pain, genital sores, penile pain and penile swelling.  Musculoskeletal: Negative for back pain, myalgias and neck stiffness.  Skin: Negative for pallor and rash.  Neurological:  Negative for dizziness, syncope, weakness and headaches.  Hematological: Negative for adenopathy. Does not bruise/bleed easily.  Psychiatric/Behavioral: Negative for behavioral problems, confusion and suicidal ideas. The patient is not nervous/anxious.     Past Medical History:  Diagnosis Date  . Chronic back pain    Dr. Wynetta Emery, "chipped disc"  . Diabetes mellitus (HCC)   . History of chicken pox      Social History   Social History  . Marital status: Single    Spouse name: N/A  . Number of children: 0  . Years of education: N/A   Occupational History  . school teacher    Social History Main Topics  . Smoking status: Never Smoker  . Smokeless tobacco: Never Used  . Alcohol use 0.0 oz/week     Comment: Glass of wine 4-5 times per week.  Heavy drinking for 10 years (ages [age])  . Drug use: No  . Sexual activity: Not on file   Other Topics Concern  . Not on file   Social History Narrative   Lives by himself in a condominium.  No children.     Works as a Runner, broadcasting/film/video.  Education: Masters   From  [place]     Past Surgical History:  Procedure Laterality Date  . NO PAST SURGERIES      Family History  Problem Relation Age of Onset  . Pneumonia Mother     Deceased, 10  . Cirrhosis Father  Deceased  . Diabetes Brother   . CAD Neg Hx   . Colon cancer Neg Hx   . Prostate cancer Neg Hx     No Known Allergies  Current Outpatient Prescriptions on File Prior to Visit  Medication Sig Dispense Refill  . azelastine (ASTELIN) 0.1 % nasal spray Place 2 sprays into both nostrils at bedtime. Use in each nostril as directed 30 mL 11  . glucose blood test strip Check blood sugar no more than twice daily 100 each 12  . Lancets (ONETOUCH ULTRASOFT) lancets Check blood sugar no more than twice daily. 100 each 12  . MAGNESIUM CITRATE PO Take by mouth.    . metFORMIN (GLUCOPHAGE) 500 MG tablet Take 1 tablet (500 mg total) by mouth 2 (two) times daily with a meal. 180 tablet 1  .  Multiple Vitamin (MULTIVITAMIN) tablet Take 1 tablet by mouth daily.     No current facility-administered medications on file prior to visit.     BP 133/89 (BP Location: Left Arm, Patient Position: Sitting, Cuff Size: Normal)   Pulse 86   Temp 98.3 F (36.8 C) (Oral)   Resp 16   Ht  (1.803 m)   Wt 198 lb 12.8 oz (90.2 kg)   SpO2 99%   BMI 27.73 kg/m       Objective:   Physical Exam  General  Mental Status - Alert. General Appearance - Well groomed. Not in acute distress.  Skin Rashes- No Rashes.  HEENT Head- Normal. Ear Auditory Canal - Left- Normal. Right - Normal.Tympanic Membrane- Left- Normal. Right- Normal. Eye Sclera/Conjunctiva- Left- Normal. Right- Normal. Nose & Sinuses Nasal Mucosa- Left-  Boggy and Congested. Right-  Boggy and  Congested.Bilateral maxillary and frontal sinus pressure. Mouth & Throat Lips: Upper Lip- Normal: no dryness, cracking, pallor, cyanosis, or vesicular eruption. Lower Lip-Normal: no dryness, cracking, pallor, cyanosis or vesicular eruption. Buccal Mucosa- Bilateral- No Aphthous ulcers. Oropharynx- No Discharge or Erythema. Tonsils: Characteristics- Bilateral- No Erythema or Congestion. Size/Enlargement- Bilateral- No enlargement. Discharge- bilateral-None.  Neck Neck- Supple. No Masses.   Chest and Lung Exam Auscultation: Breath Sounds:-Clear even and unlabored.  Cardiovascular Auscultation:Rythm- Regular, rate and rhythm. Murmurs & Other Heart Sounds:Ausculatation of the heart reveal- No Murmurs.  Lymphatic Head & Neck General Head & Neck Lymphatics: Bilateral: Description- No Localized lymphadenopathy  Abdomen- soft, non-tender, +bs, no rebound or quarding  Genital- normal. Left groin area on exam. No hernia on exam.  Chest- on palpation of left upper chest and rt lower pectoral area mild pain on palpation.  Legs- no pedal edema and negative homans signs.    Assessment & Plan:  ekg shows nsr.  For recent  allergies continue flonase and astelin. But adding xyzal.   For your atypical chest pain, mild pain breathing and fever will get cxr and ekg.(normal)  Rx Doxycyline antibiotic to cover for sinus infection, bronchitis and pneumonia.  Follow up in 10 days or as needed  Artemus Romanoff, Ramon Dredge, [employer]

## 2016-06-04 NOTE — Progress Notes (Signed)
Pre visit review using our clinic review tool, if applicable. No additional management support is needed unless otherwise documented below in the visit note. 

## 2016-06-04 NOTE — Patient Instructions (Addendum)
For recent allergies continue flonase and astelin. But adding xyzal.   For your atypical chest pain, mild pain breathing and fever will get cxr and ekg.   Rx Doxycyline antibiotic to cover for sinus infection, bronchitis and pneumonia.   Follow up in 10 days or as needed

## 2016-06-06 NOTE — Telephone Encounter (Signed)
Future labs placed. 

## 2016-06-07 ENCOUNTER — Encounter: Payer: Self-pay | Admitting: Medical

## 2016-06-07 ENCOUNTER — Ambulatory Visit (HOSPITAL_BASED_OUTPATIENT_CLINIC_OR_DEPARTMENT_OTHER)
Admission: RE | Admit: 2016-06-07 | Discharge: 2016-06-07 | Disposition: A | Discharge status: Home / Self Care | Payer: BC Managed Care – PPO | Source: Ambulatory Visit | Attending: Medical | Admitting: Medical

## 2016-06-07 ENCOUNTER — Other Ambulatory Visit: Payer: BC Managed Care – PPO

## 2016-06-07 DIAGNOSIS — R509 Fever, unspecified: Secondary | ICD-10-CM | POA: Diagnosis present

## 2016-06-07 DIAGNOSIS — R0789 Other chest pain: Secondary | ICD-10-CM | POA: Diagnosis present

## 2016-06-07 LAB — CBC WITH DIFFERENTIAL/PLATELET
Basophils Absolute: 0 {cells}/uL (ref 0–200)
Basophils Relative: 0 %
Eosinophils Absolute: 150 {cells}/uL (ref 15–500)
Eosinophils Relative: 3 %
HCT: 44.4 % (ref 38.5–50.0)
Hemoglobin: 15.2 g/dL (ref 13.2–17.1)
Lymphocytes Relative: 68 %
Lymphs Abs: 3400 {cells}/uL (ref 850–3900)
MCH: 30.2 pg (ref 27.0–33.0)
MCHC: 34.2 g/dL (ref 32.0–36.0)
MCV: 88.1 fL (ref 80.0–100.0)
MPV: 9.1 fL (ref 7.5–12.5)
Monocytes Absolute: 400 {cells}/uL (ref 200–950)
Monocytes Relative: 8 %
Neutro Abs: 1050 {cells}/uL — ABNORMAL LOW (ref 1500–7800)
Neutrophils Relative %: 21 %
Platelets: 187 10*3/uL (ref 140–400)
RBC: 5.04 MIL/uL (ref 4.20–5.80)
RDW: 13.8 % (ref 11.0–15.0)
WBC: 5 10*3/uL (ref 3.8–10.8)

## 2016-06-08 LAB — HIV ANTIBODY (ROUTINE TESTING W REFLEX): HIV 1&2 Ab, 4th Generation: NONREACTIVE

## 2016-06-10 LAB — EPSTEIN-BARR VIRUS VCA ANTIBODY PANEL
EBV NA IgG: 408 U/mL — ABNORMAL HIGH
EBV VCA IgG: 518 U/mL — ABNORMAL HIGH
EBV VCA IgM: <36 U/mL

## 2016-06-11 ENCOUNTER — Telehealth: Payer: Self-pay | Admitting: Medical

## 2016-06-11 NOTE — Telephone Encounter (Signed)
Pt states he is better and want to cancel appointment for this Friday. Will you go ahead and cancel it. Thanks,

## 2016-06-12 NOTE — Telephone Encounter (Signed)
Appt for this Friday has been canceled.(lab appt)

## 2016-06-14 ENCOUNTER — Other Ambulatory Visit: Payer: BC Managed Care – PPO

## 2016-06-20 ENCOUNTER — Telehealth (HOSPITAL_COMMUNITY): Payer: Self-pay | Admitting: Cardiology

## 2016-06-20 NOTE — Telephone Encounter (Signed)
Pt cancelled on 01/25/16 and 04/12/16 and he was contacted on 06/05/16. Patient will be removed from the workqueue.

## 2016-07-27 ENCOUNTER — Other Ambulatory Visit: Payer: Self-pay | Admitting: Medical

## 2016-08-09 LAB — HM DIABETES EYE EXAM

## 2016-10-05 ENCOUNTER — Other Ambulatory Visit: Payer: Self-pay | Admitting: Internal Medicine

## 2016-10-09 ENCOUNTER — Encounter: Payer: Self-pay | Admitting: Family Medicine

## 2016-10-09 ENCOUNTER — Ambulatory Visit (INDEPENDENT_AMBULATORY_CARE_PROVIDER_SITE_OTHER): Payer: BC Managed Care – PPO | Admitting: Family Medicine

## 2016-10-09 ENCOUNTER — Ambulatory Visit (HOSPITAL_BASED_OUTPATIENT_CLINIC_OR_DEPARTMENT_OTHER)
Admission: RE | Admit: 2016-10-09 | Discharge: 2016-10-09 | Disposition: A | Discharge status: Home / Self Care | Payer: BC Managed Care – PPO | Source: Ambulatory Visit | Attending: Family Medicine | Admitting: Family Medicine

## 2016-10-09 VITALS — BP 120/80 | HR 81 | Temp 98.2°F | Ht 70.5 in | Wt 191.4 lb

## 2016-10-09 DIAGNOSIS — X58XXXA Exposure to other specified factors, initial encounter: Secondary | ICD-10-CM | POA: Insufficient documentation

## 2016-10-09 DIAGNOSIS — S8991XA Unspecified injury of right lower leg, initial encounter: Secondary | ICD-10-CM | POA: Diagnosis not present

## 2016-10-09 DIAGNOSIS — T148XXA Other injury of unspecified body region, initial encounter: Secondary | ICD-10-CM

## 2016-10-09 MED ORDER — ACETAMINOPHEN-CODEINE 300-30 MG PO TABS: 1.0000 | ORAL_TABLET | Freq: Four times a day (QID) | ORAL | 0 refills | Status: DC | PRN

## 2016-10-09 NOTE — Patient Instructions (Signed)
Ibuprofen 400-600 mg (2-3 over the counter strength tabs) every 6 hours as needed for pain.  Ice/cold pack over area for 10-15 min every 2-3 hours while awake.  OK to take Tylenol 500 mg (1 extra strength tab) or 650 mg (2 regular strength tabs) every 6 hours as needed.  Activity as tolerated.  Only use the medication (Tylenol 3) if the home medication and ice is not working. Do not drive or do anything that requires your wits/mental capacity.

## 2016-10-09 NOTE — Progress Notes (Signed)
Musculoskeletal Exam  Patient: Samuel Rodriguez DOB: February 08, 1966  DOS: 10/09/2016  SUBJECTIVE:  Chief Complaint:   Chief Complaint  Patient presents with  . Fall    Samuel Rodriguez is a 51 y.o.  male for evaluation and treatment of R leg pain.   Onset:  1 hr ago. He fell.  Location: R leg Character:  aching  Progression of issue:  has slightly improved Associated symptoms: Pain with walking, cut skin (last tetanus shot was 2017) Treatment: to date has been - cold water bottle.   Neurovascular symptoms: no  ROS: Musculoskeletal/Extremities: +R leg pain Neurologic: no weakness   Past Medical History:  Diagnosis Date  . Chronic back pain    Dr. Wynetta Emery, "chipped disc"  . Diabetes mellitus (HCC)   . History of chicken pox    Past Surgical History:  Procedure Laterality Date  . NO PAST SURGERIES     Family History  Problem Relation Age of Onset  . Pneumonia Mother        Deceased, 55  . Cirrhosis Father        Deceased  . Diabetes Brother   . CAD Neg Hx   . Colon cancer Neg Hx   . Prostate cancer Neg Hx    Current Outpatient Prescriptions  Medication Sig Dispense Refill  . glucose blood test strip Check blood sugar no more than twice daily 100 each 12  . Lancets (ONETOUCH ULTRASOFT) lancets Check blood sugar no more than twice daily. 100 each 12  . MAGNESIUM CITRATE PO Take by mouth.    . metFORMIN (GLUCOPHAGE) 500 MG tablet Take 1 tablet (500 mg total) by mouth 2 (two) times daily with a meal. 180 tablet 0  . Multiple Vitamin (MULTIVITAMIN) tablet Take 1 tablet by mouth daily.     No Known Allergies Social History   Social History  . Marital status: Single   Occupational History  . school teacher    Social History Main Topics  . Smoking status: Never Smoker  . Smokeless tobacco: Never Used  . Alcohol use 0.0 oz/week     Comment: Glass of wine 4-5 times per week.  Heavy drinking for 10 years (ages [age])  . Drug use: No   Social History Narrative   Lives by himself in a condominium.  No children.     Works as a Runner, broadcasting/film/video.  Education: Masters   From  [place]     Objective: VITAL SIGNS: BP 120/80 (BP Location: Left Arm, Patient Position: Sitting, Cuff Size: Normal)   Pulse 81   Temp 98.2 F (36.8 C) (Oral)   Ht 5' 10.5" (1.791 m)   Wt 191 lb 6 oz (86.8 kg)   SpO2 96%   BMI 27.07 kg/m  Constitutional: Well formed, well developed. No acute distress. Cardiovascular: Brisk cap refill Thorax & Lungs: No accessory muscle use Extremities: No clubbing. No cyanosis. No edema.  Skin: Warm. Dry. No erythema. No rash.  Musculoskeletal: R leg.   Antalgic gait Tenderness to palpation: y- over distal tibia Deformity: no Ecchymosis: no Skin abrasion over ant tibia Neurologic: Normal sensory function. Psychiatric: Normal mood. Age appropriate judgment and insight. Alert & oriented x 3.    Assessment:  Injury of right lower extremity, initial encounter - Plan: DG Tibia/Fibula Right, Acetaminophen-Codeine (TYLENOL/CODEINE #3) 300-30 MG tablet  Skin abrasion  Plan: Orders as above. Tylenol 3 for breakthrough pain. NSAIDs. Ice. Activity as tolerated. TAO BID for 7 days. Don't drive on pain medicine. We  will contact him if XR read as anything other than normal.  F/u prn. The patient voiced understanding and agreement to the plan.   Jilda Roche [place], DO 10/09/16  4:45 PM

## 2016-10-11 ENCOUNTER — Encounter: Payer: BC Managed Care – PPO | Admitting: Internal Medicine

## 2016-10-22 ENCOUNTER — Encounter: Payer: BC Managed Care – PPO | Admitting: Internal Medicine

## 2016-11-01 ENCOUNTER — Encounter: Payer: BC Managed Care – PPO | Admitting: Internal Medicine

## 2016-11-29 ENCOUNTER — Encounter: Payer: BC Managed Care – PPO | Admitting: Internal Medicine

## 2016-12-02 ENCOUNTER — Ambulatory Visit (INDEPENDENT_AMBULATORY_CARE_PROVIDER_SITE_OTHER): Payer: BC Managed Care – PPO | Admitting: Internal Medicine

## 2016-12-02 ENCOUNTER — Encounter: Payer: Self-pay | Admitting: Internal Medicine

## 2016-12-02 VITALS — BP 132/78 | HR 75 | Temp 97.5°F | Resp 14 | Ht 70.5 in | Wt 193.0 lb

## 2016-12-02 DIAGNOSIS — Z0001 Encounter for general adult medical examination with abnormal findings: Secondary | ICD-10-CM | POA: Diagnosis not present

## 2016-12-02 DIAGNOSIS — M545 Low back pain: Secondary | ICD-10-CM | POA: Diagnosis not present

## 2016-12-02 DIAGNOSIS — M25562 Pain in left knee: Secondary | ICD-10-CM | POA: Diagnosis not present

## 2016-12-02 DIAGNOSIS — Z1211 Encounter for screening for malignant neoplasm of colon: Secondary | ICD-10-CM | POA: Diagnosis not present

## 2016-12-02 DIAGNOSIS — Z0000 Encounter for general adult medical examination without abnormal findings: Secondary | ICD-10-CM | POA: Insufficient documentation

## 2016-12-02 NOTE — Progress Notes (Signed)
Pre visit review using our clinic review tool, if applicable. No additional management support is needed unless otherwise documented below in the visit note. 

## 2016-12-02 NOTE — Assessment & Plan Note (Signed)
DM: On metformin, checking A1c, micro Back pain: Having exacerbation. Recommend stretching ( which usually helps), OTCs, heating pad. Call if not better Syncope: Cardiology note reviewed, no further sxs, patient was not interested in pursuing a echo. Left knee pain: also c/o knee pain. Refer to sports medicine RTC 6 months

## 2016-12-02 NOTE — Assessment & Plan Note (Signed)
-  td 2017; pnm 23: 2017; had a flu shot  -[facility]: interested in a cscope, gi re-referral  -prostate ca screening: DRE normal, check a PSA -Diet exercise discussed  -Will RTC fasting: CMP, FLP, PSA, A1c, micro

## 2016-12-02 NOTE — Progress Notes (Signed)
Subjective:    Patient ID: Samuel Rodriguez, male    DOB: [birth year], 51 y.o.   MRN: [id number]  DOS:  12/02/2016 Type of visit - description : CPX Interval history: Eating  healthier for the last 6 weeks. Good compliance of medication.    Review of Systems Has back pain for the last 2 weeks, located at the left sacroiliac area, no radiation, worse after he sits down for a while and start walking, decrease with stretching. Also occasional left knee pain. Was seen with the syncope by cardiology, no further symptoms  Other than above, a 14 point review of systems is negative     Past Medical History:  Diagnosis Date  . Chronic back pain    Dr. Saintclair Halsted, "chipped disc"  . Diabetes mellitus ([place])   . History of chicken pox     Past Surgical History:  Procedure Laterality Date  . NO PAST SURGERIES      Social History   Social History  . Marital status: Single    Spouse name: N/A  . Number of children: 0  . Years of education: N/A   Occupational History  . school teacher    Social History Main Topics  . Smoking status: Never Smoker  . Smokeless tobacco: Never Used  . Alcohol use 0.0 oz/week     Comment: Glass of wine 4-5 times per week.  Heavy drinking for 10 years (ages [age])  . Drug use: No  . Sexual activity: Not on file   Other Topics Concern  . Not on file   Social History Narrative   Lives by himself in a condominium.  No children.     Works as a Pharmacist, hospital.  Education: Masters   From  [place]      Family History  Problem Relation Age of Onset  . Pneumonia Mother        Deceased, 39  . Cirrhosis Father        Deceased  . Diabetes Brother   . CAD Neg Hx   . Colon cancer Neg Hx   . Prostate cancer Neg Hx      Allergies as of 12/02/2016   No Known Allergies     Medication List       Accurate as of 12/02/16  5:04 PM. Always use your most recent med list.          glucose blood test strip Check blood sugar no more than twice daily     MAGNESIUM CITRATE PO Take by mouth.   metFORMIN 500 MG tablet Commonly known as:  GLUCOPHAGE Take 1 tablet (500 mg total) by mouth 2 (two) times daily with a meal.   multivitamin tablet Take 1 tablet by mouth daily.   onetouch ultrasoft lancets Check blood sugar no more than twice daily.          Objective:   Physical Exam BP 132/78 (BP Location: Left Arm, Patient Position: Sitting, Cuff Size: Small)   Pulse 75   Temp (!) 97.5 F (36.4 C) (Oral)   Resp 14   Ht 5' 10.5" (1.791 m)   Wt 193 lb (87.5 kg)   SpO2 97%   BMI 27.30 kg/m   General:   Well developed, well nourished . NAD.  Neck: No  thyromegaly  HEENT:  Normocephalic . Face symmetric, atraumatic Lungs:  CTA B Normal respiratory effort, no intercostal retractions, no accessory muscle use. Heart: RRR,  no murmur.  No pretibial edema bilaterally  Abdomen:  Not distended, soft, non-tender. No rebound or rigidity.   Skin: Exposed areas without rash. Not pale. Not jaundice MSK: Slightly TTP at the left sacroiliac area  Neurologic:  alert & oriented X3.  Speech normal, gait appropriate for age and unassisted Strength symmetric and appropriate for age.  Motor symmetric, straight leg test negative Psych: Cognition and judgment appear intact.  Cooperative with normal attention span and concentration.  Behavior appropriate. No anxious or depressed appearing.    Assessment & Plan:   Assessment: DM A1C 6.3 (04-2015) Subacute sensorimotor polyneuropathy, demyelinating-slowing and axon loss?  Likely d/t DM.  W/U 2017:Normal CSF. Normal ESR, SSA/B, copper, SPEP with IFE, vitamin B1, TSH, vitamin B12.  Follow clinically, may need to repeat NCS/EMG if symptoms worsen Benign fasciculations  Neuro 2017: could be related to foraminal stenosis since he has both L4 and L5 nerve impingement previously seen on MRI. Repeat MRI lumbar spine if he develops new weakness or pain Back pain  11-2013, MRI, saw Dr Saintclair Halsted, Left  L3-L4 problem, was rx surgery, only did PT, strength returned w/ PT Syncope 2 , 2017: saw cards, "cough mediated", pt didn't pursue a echo  PLAN DM: On metformin, checking A1c, micro Back pain: Having exacerbation. Recommend stretching ( which usually helps), OTCs, heating pad. Call if not better Syncope: Cardiology note reviewed, no further sxs, patient was not interested in pursuing a echo. Left knee pain: also c/o knee pain. Refer to sports medicine RTC 6 months

## 2016-12-02 NOTE — Patient Instructions (Addendum)
  GO TO THE FRONT DESK Schedule your next appointment for a  checkup in 6 months  Schedule labs to be done fasting this week   Heating pad Stretching Tylenol  500 mg OTC 2 tabs a day every 8 hours as needed for pain Or IBUPROFEN (Advil or Motrin) 200 mg 2 tablets every 6 hours as needed for pain.  Always take it with food because may cause gastritis and ulcers.  If you notice nausea, stomach pain, change in the color of stools --->  Stop the medicine and let us know

## 2016-12-06 ENCOUNTER — Other Ambulatory Visit: Payer: BC Managed Care – PPO

## 2016-12-06 ENCOUNTER — Ambulatory Visit (INDEPENDENT_AMBULATORY_CARE_PROVIDER_SITE_OTHER): Payer: BC Managed Care – PPO | Admitting: Family Medicine

## 2016-12-06 ENCOUNTER — Encounter: Payer: Self-pay | Admitting: Family Medicine

## 2016-12-06 ENCOUNTER — Other Ambulatory Visit (INDEPENDENT_AMBULATORY_CARE_PROVIDER_SITE_OTHER): Payer: BC Managed Care – PPO

## 2016-12-06 DIAGNOSIS — M79605 Pain in left leg: Secondary | ICD-10-CM | POA: Diagnosis not present

## 2016-12-06 DIAGNOSIS — M545 Low back pain, unspecified: Secondary | ICD-10-CM

## 2016-12-06 DIAGNOSIS — Z0000 Encounter for general adult medical examination without abnormal findings: Secondary | ICD-10-CM

## 2016-12-06 LAB — COMPREHENSIVE METABOLIC PANEL
ALT: 47 U/L (ref 0–53)
AST: 37 U/L (ref 0–37)
Albumin: 4.2 g/dL (ref 3.5–5.2)
Alkaline Phosphatase: 62 U/L (ref 39–117)
BUN: 20 mg/dL (ref 6–23)
CO2: 30 mEq/L (ref 19–32)
Calcium: 9.4 mg/dL (ref 8.4–10.5)
Chloride: 101 mEq/L (ref 96–112)
Creatinine, Ser: 0.9 mg/dL (ref 0.40–1.50)
GFR: 93.77 mL/min (ref >60.00)
Glucose, Bld: 125 mg/dL — ABNORMAL HIGH (ref 70–99)
Potassium: 4.4 mEq/L (ref 3.5–5.1)
Sodium: 138 mEq/L (ref 135–145)
Total Bilirubin: 0.8 mg/dL (ref 0.2–1.2)
Total Protein: 6.9 g/dL (ref 6.0–8.3)

## 2016-12-06 LAB — LIPID PANEL
Cholesterol: 186 mg/dL (ref 0–200)
HDL: 43.7 mg/dL (ref >39.00)
LDL Cholesterol: 125 mg/dL — ABNORMAL HIGH (ref 0–99)
NonHDL: 142.73
Total CHOL/HDL Ratio: 4
Triglycerides: 91 mg/dL (ref 0.0–149.0)
VLDL: 18.2 mg/dL (ref 0.0–40.0)

## 2016-12-06 LAB — MICROALBUMIN / CREATININE URINE RATIO
Creatinine,U: 144 mg/dL
Microalb Creat Ratio: 0.5 mg/g (ref 0.0–30.0)
Microalb, Ur: <0.7 mg/dL (ref 0.0–1.9)

## 2016-12-06 LAB — HEMOGLOBIN A1C: Hgb A1c MFr Bld: 6.1 % (ref 4.6–6.5)

## 2016-12-06 LAB — PSA: PSA: 0.54 ng/mL (ref 0.10–4.00)

## 2016-12-06 NOTE — Patient Instructions (Signed)
Your knee exam is normal. Your pain is a referred pain from nerve irritation in your back. Consider revisiting physical therapy - call me if you want to do this. Strengthening of low back muscles, abdominal musculature are key for long term pain relief - pick 3-5 exercises and do them every day (at most twice a day). Take tylenol for baseline pain relief (1-2 extra strength tabs 3x/day) if needed. Consider prednisone dose pack but I'd be very judicious with your diabetes and neuropathy. Aleve 2 tabs twice a day with food OR ibuprofen 600mg  three times a day with food for pain and inflammation - typically take for 7-10 days then as needed. Stay as active as possible. If not improving, will consider physical therapy. Follow up with me in 1 month.

## 2016-12-12 DIAGNOSIS — M79605 Pain in left leg: Principal | ICD-10-CM

## 2016-12-12 DIAGNOSIS — M545 Low back pain, unspecified: Secondary | ICD-10-CM | POA: Insufficient documentation

## 2016-12-12 NOTE — Assessment & Plan Note (Signed)
consistent with lumbar radiculopathy.  We discussed options - he would like to do OTC meds (tylenol, aleve or ibuprofen) and home exercise program for now.  He will consider prednisone dose pack, physical therapy.  Follow up with us in 1 month otherwise.

## 2016-12-12 NOTE — Progress Notes (Signed)
PCP and consultation requested by: Wanda Plump, MD  Subjective:   HPI: Patient is a 51 y.o. male here for left knee, back pain.  Patient reports he's had left sided low back pain and knee pain for about 3 weeks. No acute injury or trauma. Pain level 0/10 at rest but up to 5/10 and sharp at worst. Knee feels cold and burns. Worse with prolonged standing. Not had any treatment to date. Ok at nighttime. Feels better to rub the area of pain. Doing home exercises. 3 years ago states he 'cracked a disc' and did PT, improved. No bowel/bladder dysfunction.  Past Medical History:  Diagnosis Date  . Chronic back pain    Dr. Wynetta Emery, "chipped disc"  . Diabetes mellitus (HCC)   . History of chicken pox     Current Outpatient Prescriptions on File Prior to Visit  Medication Sig Dispense Refill  . glucose blood test strip Check blood sugar no more than twice daily 100 each 12  . Lancets (ONETOUCH ULTRASOFT) lancets Check blood sugar no more than twice daily. 100 each 12  . MAGNESIUM CITRATE PO Take by mouth.    . metFORMIN (GLUCOPHAGE) 500 MG tablet Take 1 tablet (500 mg total) by mouth 2 (two) times daily with a meal. 180 tablet 0  . Multiple Vitamin (MULTIVITAMIN) tablet Take 1 tablet by mouth daily.     No current facility-administered medications on file prior to visit.     Past Surgical History:  Procedure Laterality Date  . NO PAST SURGERIES      No Known Allergies  Social History   Social History  . Marital status: Single    Spouse name: N/A  . Number of children: 0  . Years of education: N/A   Occupational History  . school teacher    Social History Main Topics  . Smoking status: Never Smoker  . Smokeless tobacco: Never Used  . Alcohol use 0.0 oz/week     Comment: Glass of wine 4-5 times per week.  Heavy drinking for 10 years (ages [age])  . Drug use: No  . Sexual activity: Not on file   Other Topics Concern  . Not on file   Social History Narrative   Lives  by himself in a condominium.  No children.     Works as a Runner, broadcasting/film/video.  Education: Masters   From  [place]     Family History  Problem Relation Age of Onset  . Pneumonia Mother        Deceased, 13  . Cirrhosis Father        Deceased  . Diabetes Brother   . CAD Neg Hx   . Colon cancer Neg Hx   . Prostate cancer Neg Hx     BP (!) 142/98   Pulse 79   Ht 5\' 11"  (1.803 m)   Wt 193 lb (87.5 kg)   BMI 26.92 kg/m   Review of Systems: See HPI above.     Objective:  Physical Exam:  Gen: NAD, comfortable in exam room  Back: No gross deformity, scoliosis. No TTP currently.  No midline or bony TTP. FROM. Strength LEs 5/5 all muscle groups.   1+ MSRs in patellar and achilles tendons, equal bilaterally. Mild positive SLR left, negative right. Sensation intact to light touch bilaterally. Negative logroll bilateral hips Negative fabers and piriformis stretches.  Left knee: No gross deformity, ecchymoses, swelling. No TTP. FROM. Negative ant/post drawers. Negative valgus/varus testing. Negative lachmanns. Negative mcmurrays, apleys,  patellar apprehension. NV intact distally.   Assessment & Plan:  1. Low back pain with radiation into left leg - consistent with lumbar radiculopathy.  We discussed options - he would like to do OTC meds (tylenol, aleve or ibuprofen) and home exercise program for now.  He will consider prednisone dose pack, physical therapy.  Follow up with us in 1 month otherwise.

## 2017-01-02 ENCOUNTER — Encounter: Payer: Self-pay | Admitting: Internal Medicine

## 2017-01-03 ENCOUNTER — Ambulatory Visit: Payer: BC Managed Care – PPO | Admitting: Family Medicine

## 2017-01-04 ENCOUNTER — Other Ambulatory Visit: Payer: Self-pay | Admitting: Internal Medicine

## 2017-03-20 ENCOUNTER — Encounter: Payer: Self-pay | Admitting: Internal Medicine

## 2017-03-24 ENCOUNTER — Encounter: Payer: Self-pay | Admitting: Internal Medicine

## 2017-03-24 ENCOUNTER — Ambulatory Visit: Payer: BC Managed Care – PPO | Admitting: Internal Medicine

## 2017-03-24 VITALS — BP 128/72 | HR 91 | Temp 98.1°F | Resp 14 | Ht 71.0 in | Wt 190.5 lb

## 2017-03-24 DIAGNOSIS — K047 Periapical abscess without sinus: Secondary | ICD-10-CM

## 2017-03-24 MED ORDER — DOXYCYCLINE HYCLATE 100 MG PO TABS: 100.0000 mg | ORAL_TABLET | Freq: Two times a day (BID) | ORAL | 0 refills | Status: DC

## 2017-03-24 NOTE — Assessment & Plan Note (Signed)
Dental abscess: Likely has a dental abscess, recommend to see dentist ASAP, cover with doxycycline for now. See AVS

## 2017-03-24 NOTE — Progress Notes (Signed)
Pre visit review using our clinic review tool, if applicable. No additional management support is needed unless otherwise documented below in the visit note. 

## 2017-03-24 NOTE — Patient Instructions (Signed)
Take doxycycline for 1 week  See your dentist ASAP  Call me or your dentist if you have fever, chills, swelling.

## 2017-03-24 NOTE — Progress Notes (Signed)
Subjective:    Patient ID: Samuel Rodriguez, male    DOB: [birth year], 52 y.o.   MRN: [id number]  DOS:  03/24/2017 Type of visit - description : acute Interval history: Approximately August 2018 went to [place], saw a Database administrator , a left lower crown was changed. Since then the area has been very sensitive particularly after eating. More recently, noted a swollen area at the gum. Needs to see a dentist but in the meantime would like to get an antibiotic.   Review of Systems  No fever chills No discharge No actual pain , area is simply sensitive.  Past Medical History:  Diagnosis Date  . Chronic back pain    Dr. Saintclair Halsted, "chipped disc"  . Diabetes mellitus ([place])   . History of chicken pox     Past Surgical History:  Procedure Laterality Date  . NO PAST SURGERIES      Social History   Socioeconomic History  . Marital status: Single    Spouse name: Not on file  . Number of children: 0  . Years of education: Not on file  . Highest education level: Not on file  Social Needs  . Financial resource strain: Not on file  . Food insecurity - worry: Not on file  . Food insecurity - inability: Not on file  . Transportation needs - medical: Not on file  . Transportation needs - non-medical: Not on file  Occupational History  . Occupation: Education officer, museum  Tobacco Use  . Smoking status: Never Smoker  . Smokeless tobacco: Never Used  Substance and Sexual Activity  . Alcohol use: Yes    Alcohol/week: 0.0 oz    Comment: Glass of wine 4-5 times per week.  Heavy drinking for 10 years (ages [age])  . Drug use: No  . Sexual activity: Not on file  Other Topics Concern  . Not on file  Social History Narrative   Lives by himself in a condominium.  No children.     Works as a Pharmacist, hospital.  Education: Masters   From  [place]       Allergies as of 03/24/2017   No Known Allergies     Medication List        Accurate as of 03/24/17 12:45 PM. Always use your most recent med list.           doxycycline 100 MG tablet Commonly known as:  VIBRA-TABS Take 1 tablet (100 mg total) by mouth 2 (two) times daily.   glucose blood test strip Check blood sugar no more than twice daily   MAGNESIUM CITRATE PO Take by mouth.   metFORMIN 500 MG tablet Commonly known as:  GLUCOPHAGE Take 1 tablet (500 mg total) 2 (two) times daily with a meal by mouth.   multivitamin tablet Take 1 tablet by mouth daily.   onetouch ultrasoft lancets Check blood sugar no more than twice daily.          Objective:   Physical Exam  HENT:  Mouth/Throat:     BP 128/72 (BP Location: Left Arm, Patient Position: Sitting, Cuff Size: Normal)   Pulse 91   Temp 98.1 F (36.7 C) (Oral)   Resp 14   Ht '5\' 11"'  (1.803 m)   Wt 190 lb 8 oz (86.4 kg)   SpO2 97%   BMI 26.57 kg/m  General:   Well developed, well nourished . NAD.  HEENT:  Normocephalic . Face symmetric, atraumatic  Skin: Not pale. Not jaundice  Neurologic:  alert & oriented X3.  Speech normal, gait appropriate for age and unassisted Psych--  Cognition and judgment appear intact.  Cooperative with normal attention span and concentration.  Behavior appropriate. No anxious or depressed appearing.      Assessment & Plan:    Assessment: DM A1C 6.3 (04-2015) Subacute sensorimotor polyneuropathy, demyelinating-slowing and axon loss?  Likely d/t DM.  W/U 2017:Normal CSF. Normal ESR, SSA/B, copper, SPEP with IFE, vitamin B1, TSH, vitamin B12.  Follow clinically, may need to repeat NCS/EMG if symptoms worsen Benign fasciculations  Neuro 2017: could be related to foraminal stenosis since he has both L4 and L5 nerve impingement previously seen on MRI. Repeat MRI lumbar spine if he develops new weakness or pain Back pain  11-2013, MRI, saw Dr Saintclair Halsted, Left L3-L4 problem, was rx surgery, only did PT, strength returned w/ PT Syncope 2 , 2017: saw cards, "cough mediated", pt didn't pursue a echo  PLAN Dental abscess: Likely has  a dental abscess, recommend to see dentist ASAP, cover with doxycycline for now. See AVS

## 2017-04-02 ENCOUNTER — Encounter: Payer: Self-pay | Admitting: Internal Medicine

## 2017-05-24 IMAGING — XA DG FLUORO GUIDE LUMBAR PUNCTURE
1 series · 1 of 1 positions shown · non-contrast
Comparison: none

CLINICAL DATA: Numbness of unknown etiology.

[Series 1: ortho standard · 1 of 1 slices shown]
[im 1/1]
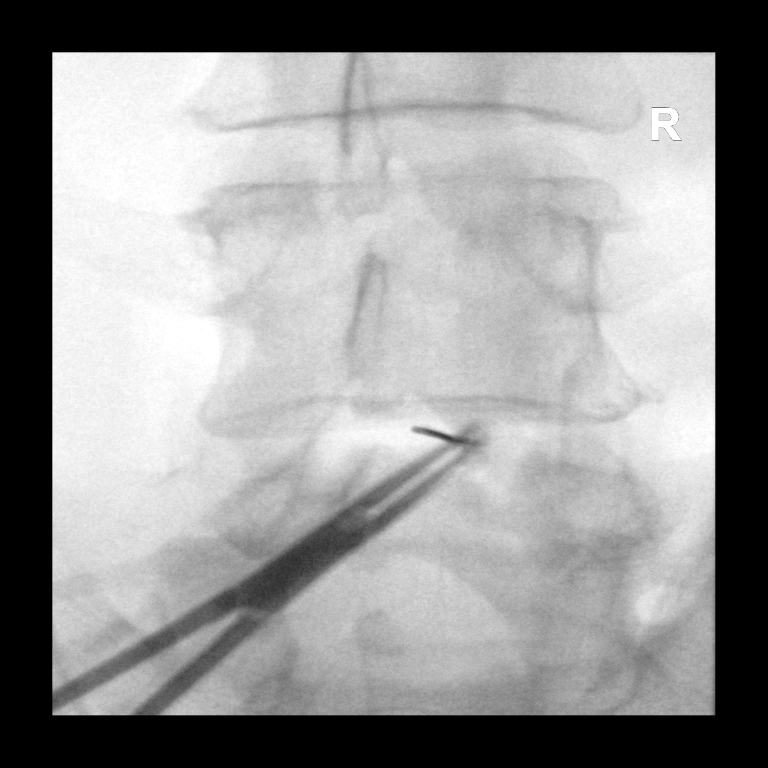

[1 of 1 positions shown; findings below may reference images not displayed]

EXAM:
DIAGNOSTIC LUMBAR PUNCTURE UNDER FLUOROSCOPIC GUIDANCE

FLUOROSCOPY TIME:  Radiation Exposure Index (as provided by the
fluoroscopic device): 0 minutes 15 seconds. 30.11 micro gray meter
squared

PROCEDURE:
Informed consent was obtained from the patient prior to the
procedure, including potential complications of headache, allergy,
and pain. With the patient prone, the lower back was prepped with
Betadine. 1% Lidocaine was used for local anesthesia. Lumbar
puncture was performed at the right L4-5 level using a 20 gauge
needle with return of clear CSF with an opening pressure of 18 cm
water. This was measured in the lateral decubitus position. Fifteen
Ml of CSF were obtained for laboratory studies. The patient
tolerated the procedure well and there were no apparent
complications.
IMPRESSION: Lumbar puncture on the right at L4-5.

## 2017-06-06 ENCOUNTER — Ambulatory Visit: Payer: BC Managed Care – PPO | Admitting: Internal Medicine

## 2017-06-20 ENCOUNTER — Ambulatory Visit: Payer: BC Managed Care – PPO | Admitting: Internal Medicine

## 2017-08-11 ENCOUNTER — Encounter: Payer: Self-pay | Admitting: Internal Medicine

## 2017-08-11 ENCOUNTER — Ambulatory Visit: Payer: BC Managed Care – PPO | Admitting: Internal Medicine

## 2017-08-11 VITALS — BP 126/68 | HR 67 | Temp 97.7°F | Resp 14 | Ht 71.0 in | Wt 192.2 lb

## 2017-08-11 DIAGNOSIS — G629 Polyneuropathy, unspecified: Secondary | ICD-10-CM

## 2017-08-11 DIAGNOSIS — E785 Hyperlipidemia, unspecified: Secondary | ICD-10-CM | POA: Diagnosis not present

## 2017-08-11 DIAGNOSIS — E118 Type 2 diabetes mellitus with unspecified complications: Secondary | ICD-10-CM

## 2017-08-11 LAB — LDL CHOLESTEROL, DIRECT: Direct LDL: 122 mg/dL

## 2017-08-11 LAB — LIPID PANEL
Cholesterol: 193 mg/dL (ref 0–200)
HDL: 39.1 mg/dL (ref >39.00)
NonHDL: 154.07
Total CHOL/HDL Ratio: 5
Triglycerides: 229 mg/dL — ABNORMAL HIGH (ref 0.0–149.0)
VLDL: 45.8 mg/dL — ABNORMAL HIGH (ref 0.0–40.0)

## 2017-08-11 LAB — HEMOGLOBIN A1C: Hgb A1c MFr Bld: 6.4 % (ref 4.6–6.5)

## 2017-08-11 MED ORDER — GABAPENTIN 300 MG PO CAPS: 300.0000 mg | ORAL_CAPSULE | Freq: Every day | ORAL | 6 refills | Status: DC

## 2017-08-11 MED ORDER — METFORMIN HCL 500 MG PO TABS: 500.0000 mg | ORAL_TABLET | Freq: Two times a day (BID) | ORAL | 1 refills | Status: DC

## 2017-08-11 MED FILL — metFORMIN HCL 500 MG TABS: 500 | 90 days supply | Qty: 180 | Fill #0

## 2017-08-11 MED FILL — GABAPENTIN 300 MG CAPSULE: 300 | 30 days supply | Qty: 30 | Fill #0

## 2017-08-11 NOTE — Patient Instructions (Signed)
GO TO THE LAB : Get the blood work     GO TO THE FRONT DESK Schedule your next appointment for a physical exam by October 2019   For the leg cramps: Try diet tonic water daily. Try gabapentin 300 mg: 1 tablet every night, if you get some benefit, we can always increase the dose.  Let me know.

## 2017-08-11 NOTE — Progress Notes (Signed)
Subjective:    Patient ID: Samuel Rodriguez, male    DOB: 03-Feb-1966, 52 y.o.   MRN: [id number]  DOS:  08/11/2017 Type of visit - description : f/u Interval history: DM: Doing better with diet, due for labs, good med compliance. Slightly elevated cholesterol: Due for FLP 10 years history of ill-defined discomfort on the left or right groin; reports the pain sometimes is related to emotional stress, sometimes related to increase physical activity. Also, more than 20 years history of leg cramps, used to get better with ibuprofen or Tylenol, they are usually triggered to "exposure to cold" like for instance if he  enters a building with the [facility] on. Symptoms may last several days, and happen day or night.   Review of Systems Denies back pain, denies any bulging area at the groins. No chest pain or difficulty breathing  Past Medical History:  Diagnosis Date  . Chronic back pain    Dr. Saintclair Halsted, "chipped disc"  . Diabetes mellitus ([place])   . History of chicken pox     Past Surgical History:  Procedure Laterality Date  . NO PAST SURGERIES      Social History   Socioeconomic History  . Marital status: Single    Spouse name: Not on file  . Number of children: 0  . Years of education: Not on file  . Highest education level: Not on file  Occupational History  . Occupation: Education officer, museum  Social Needs  . Financial resource strain: Not on file  . Food insecurity:    Worry: Not on file    Inability: Not on file  . Transportation needs:    Medical: Not on file    Non-medical: Not on file  Tobacco Use  . Smoking status: Never Smoker  . Smokeless tobacco: Never Used  Substance and Sexual Activity  . Alcohol use: Yes    Alcohol/week: 0.0 oz    Comment: Glass of wine 4-5 times per week.  Heavy drinking for 10 years (ages [age])  . Drug use: No  . Sexual activity: Not on file  Lifestyle  . Physical activity:    Days per week: Not on file    Minutes per session: Not on file  .  Stress: Not on file  Relationships  . Social connections:    Talks on phone: Not on file    Gets together: Not on file    Attends religious service: Not on file    Active member of club or organization: Not on file    Attends meetings of clubs or organizations: Not on file    Relationship status: Not on file  . Intimate partner violence:    Fear of current or ex partner: Not on file    Emotionally abused: Not on file    Physically abused: Not on file    Forced sexual activity: Not on file  Other Topics Concern  . Not on file  Social History Narrative   Lives by himself in a condominium.  No children.     Works as a Pharmacist, hospital.  Education: Masters   From  [place]       Allergies as of 08/11/2017   No Known Allergies     Medication List        Accurate as of 08/11/17  3:44 PM. Always use your most recent med list.          gabapentin 300 MG capsule Commonly known as:  NEURONTIN Take 1 capsule (300  mg total) by mouth at bedtime.   glucose blood test strip Check blood sugar no more than twice daily   MAGNESIUM CITRATE PO Take by mouth.   metFORMIN 500 MG tablet Commonly known as:  GLUCOPHAGE Take 1 tablet (500 mg total) by mouth 2 (two) times daily with a meal.   multivitamin tablet Take 1 tablet by mouth daily.   onetouch ultrasoft lancets Check blood sugar no more than twice daily.          Objective:   Physical Exam BP 126/68 (BP Location: Left Arm, Patient Position: Sitting, Cuff Size: Small)   Pulse 67   Temp 97.7 F (36.5 C) (Oral)   Resp 14   Ht '5\' 11"'  (1.803 m)   Wt 192 lb 4 oz (87.2 kg)   SpO2 97%   BMI 26.81 kg/m  General:   Well developed, NAD, see BMI.  HEENT:  Normocephalic . Face symmetric, atraumatic Lungs:  CTA B Normal respiratory effort, no intercostal retractions, no accessory muscle use. Heart: RRR,  no murmur.  no pretibial edema bilaterally, normal femoral and pedal pulses Abdomen:  Not distended, soft, non-tender. No  rebound or rigidity. DIABETIC FEET EXAM: Skin normal, nails normal, no calluses Pinprick examination with patchy decreased sensitivity to pinprick examination Skin: Not pale. Not jaundice Neurologic:  alert & oriented X3.  Speech normal, gait appropriate for age and unassisted Psych--  Cognition and judgment appear intact.  Cooperative with normal attention span and concentration.  Behavior appropriate. No anxious or depressed appearing.     Assessment & Plan:    Assessment: DM A1C 6.3 (04-2015) Dyslipidemia Subacute sensorimotor polyneuropathy, demyelinating-slowing and axon loss?  Likely d/t DM.  W/U 2017:Normal CSF. Normal ESR, SSA/B, copper, SPEP with IFE, vitamin B1, TSH, vitamin B12.  Follow clinically, may need to repeat NCS/EMG if symptoms worsen Benign fasciculations  Neuro 2017: could be related to foraminal stenosis since he has both L4 and L5 nerve impingement previously seen on MRI. Repeat MRI lumbar spine if he develops new weakness or pain Back pain  11-2013, MRI, saw Dr Saintclair Halsted, Left L3-L4 problem, was rx surgery, only did PT, strength returned w/ PT Syncope 2 , 2017: saw cards, "cough mediated", pt didn't pursue a echo  PLAN DM: On metformin, encourage healthy diet, check a A1c.  Eye exam is scheduled for this week Neuropathy: Has a well-documented history of neuropathy and benign fasciculations, reports LE sxs as described in the HPI,  not worse at night; I wonder if they are symptoms from neuropathy. Plan: Trial with gabapentin 300 mg nightly, okay to increase the dose if he gets some help.  Also, recommend a trial with tonic water. Dyslipidemia: Last LDL 125, goal 100, will recheck a FLP, he would be willing to take a medication if not close to goal. RTC CPX 11-2017

## 2017-08-11 NOTE — Assessment & Plan Note (Signed)
DM: On metformin, encourage healthy diet, check a A1c.  Eye exam is scheduled for this week Neuropathy: Has a well-documented history of neuropathy and benign fasciculations, reports LE sxs as described in the HPI,  not worse at night; I wonder if they are symptoms from neuropathy. Plan: Trial with gabapentin 300 mg nightly, okay to increase the dose if he gets some help.  Also, recommend a trial with tonic water. Dyslipidemia: Last LDL 125, goal 100, will recheck a FLP, he would be willing to take a medication if not close to goal. RTC CPX 11-2017

## 2017-08-11 NOTE — Progress Notes (Signed)
Pre visit review using our clinic review tool, if applicable. No additional management support is needed unless otherwise documented below in the visit note. 

## 2017-08-14 ENCOUNTER — Encounter: Payer: Self-pay | Admitting: Internal Medicine

## 2017-08-14 MED ORDER — ATORVASTATIN CALCIUM 20 MG PO TABS: 20.0000 mg | ORAL_TABLET | Freq: Every day | ORAL | 2 refills | Status: DC

## 2017-08-14 MED FILL — ATORVASTATIN 20 MG TABLET: 20 | 30 days supply | Qty: 30 | Fill #0

## 2017-08-14 NOTE — Addendum Note (Signed)
Addended byConrad Gravette: Floetta Brickey D on: 08/14/2017 09:29 AM   Modules accepted: Orders

## 2017-09-11 ENCOUNTER — Encounter: Payer: Self-pay | Admitting: Internal Medicine

## 2017-09-11 ENCOUNTER — Telehealth: Payer: Self-pay

## 2017-09-11 MED FILL — GABAPENTIN 300 MG CAPSULE: 300 | 30 days supply | Qty: 30 | Fill #1

## 2017-09-11 MED FILL — ATORVASTATIN 20 MG TABLET: 20 | 30 days supply | Qty: 30 | Fill #1

## 2017-09-11 NOTE — Telephone Encounter (Signed)
Author phoned pharmacy and they stated pt. seemed confused about his medication, but they are currently working on filling his gabapentin. Author made pharmacy aware that pt. also needs his atorvastatin filled, and pharmacist said she would work on that one as well. Author phoned pt. to notify that both meds will be ready for pick-up; no answer. Detailed VM left.

## 2017-09-27 IMAGING — DX DG CHEST 2V
2 series · 2 of 2 positions shown · non-contrast
Comparison: None.

CLINICAL DATA: Cough and fever for several days

EXAM:
CHEST  2 VIEW

[chest pa]
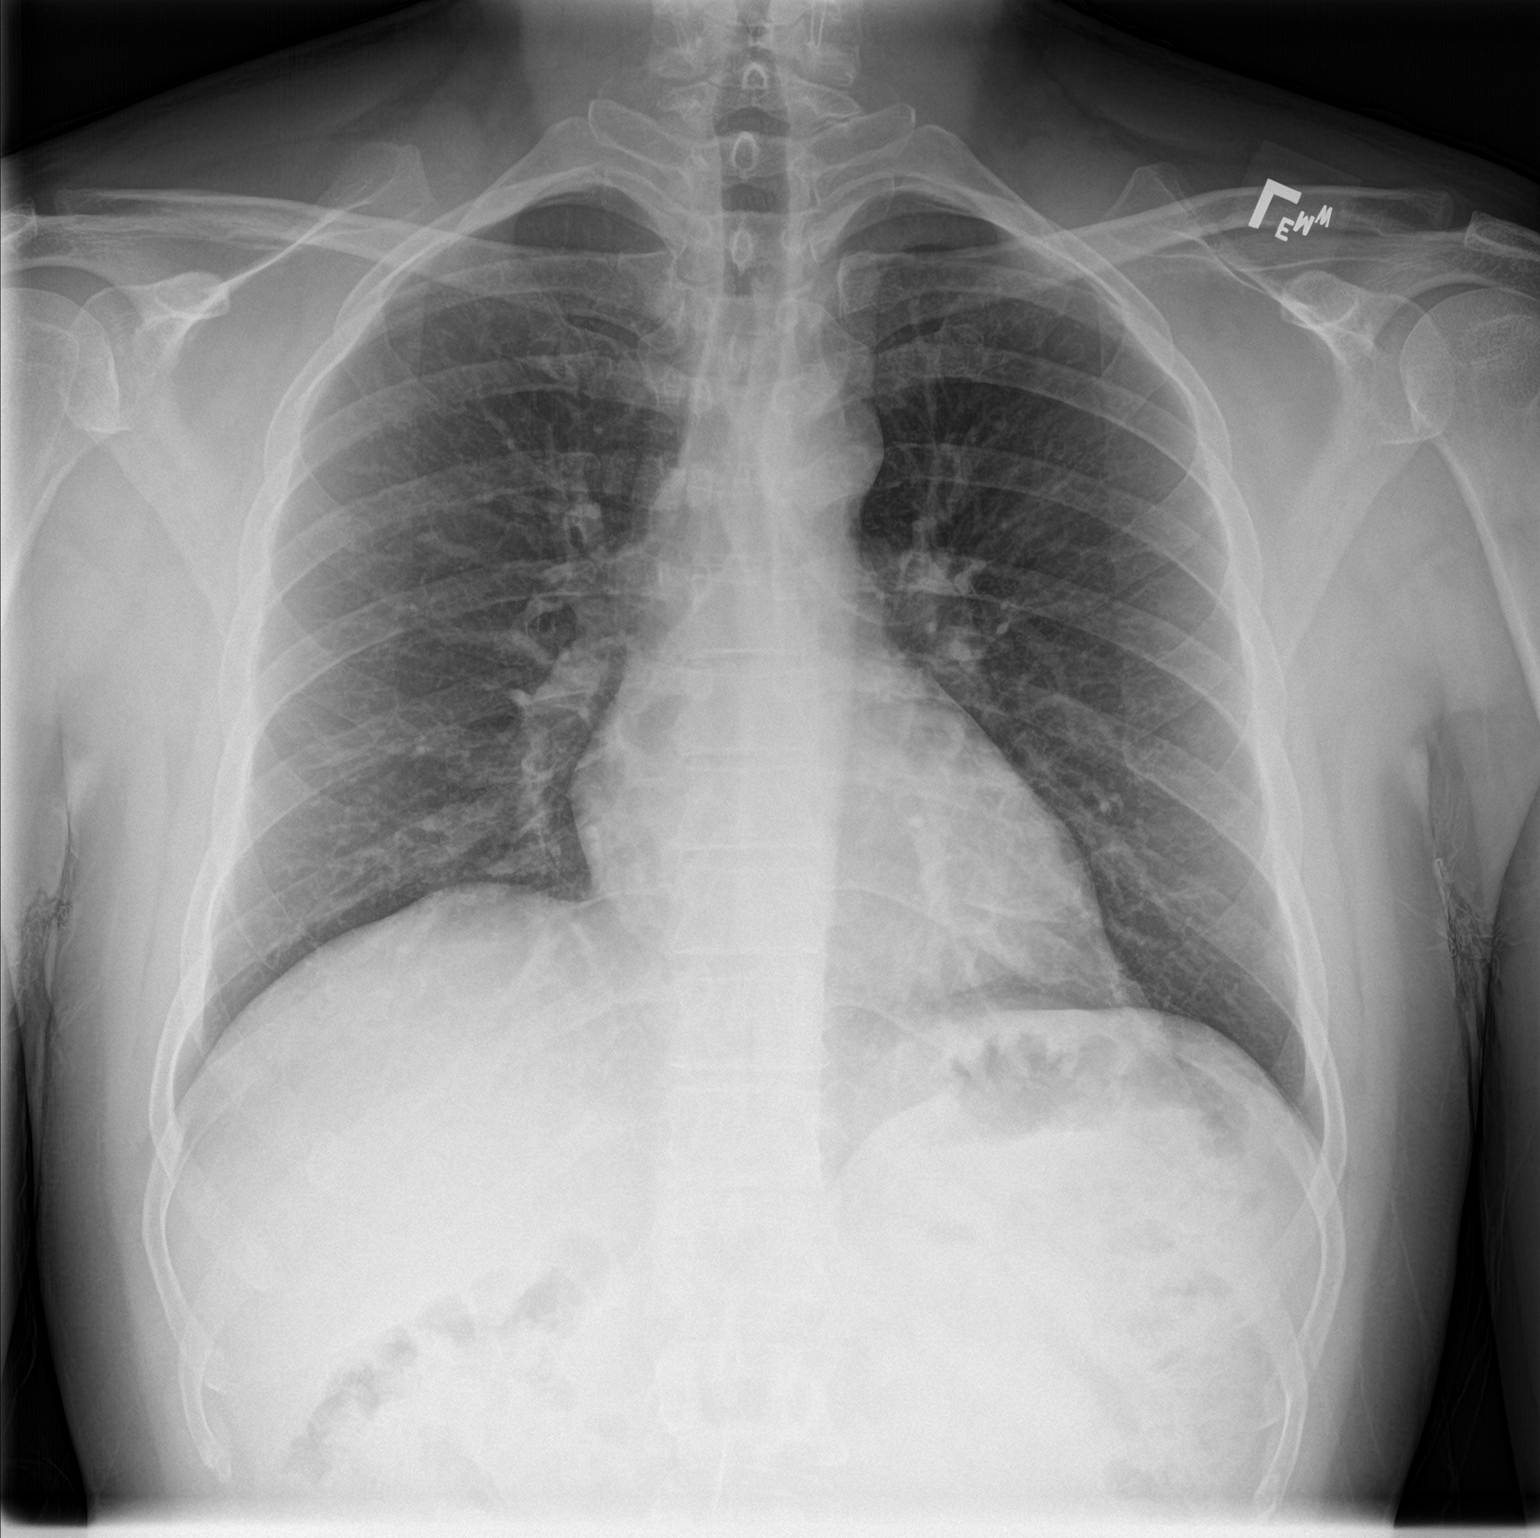

[chest lat]
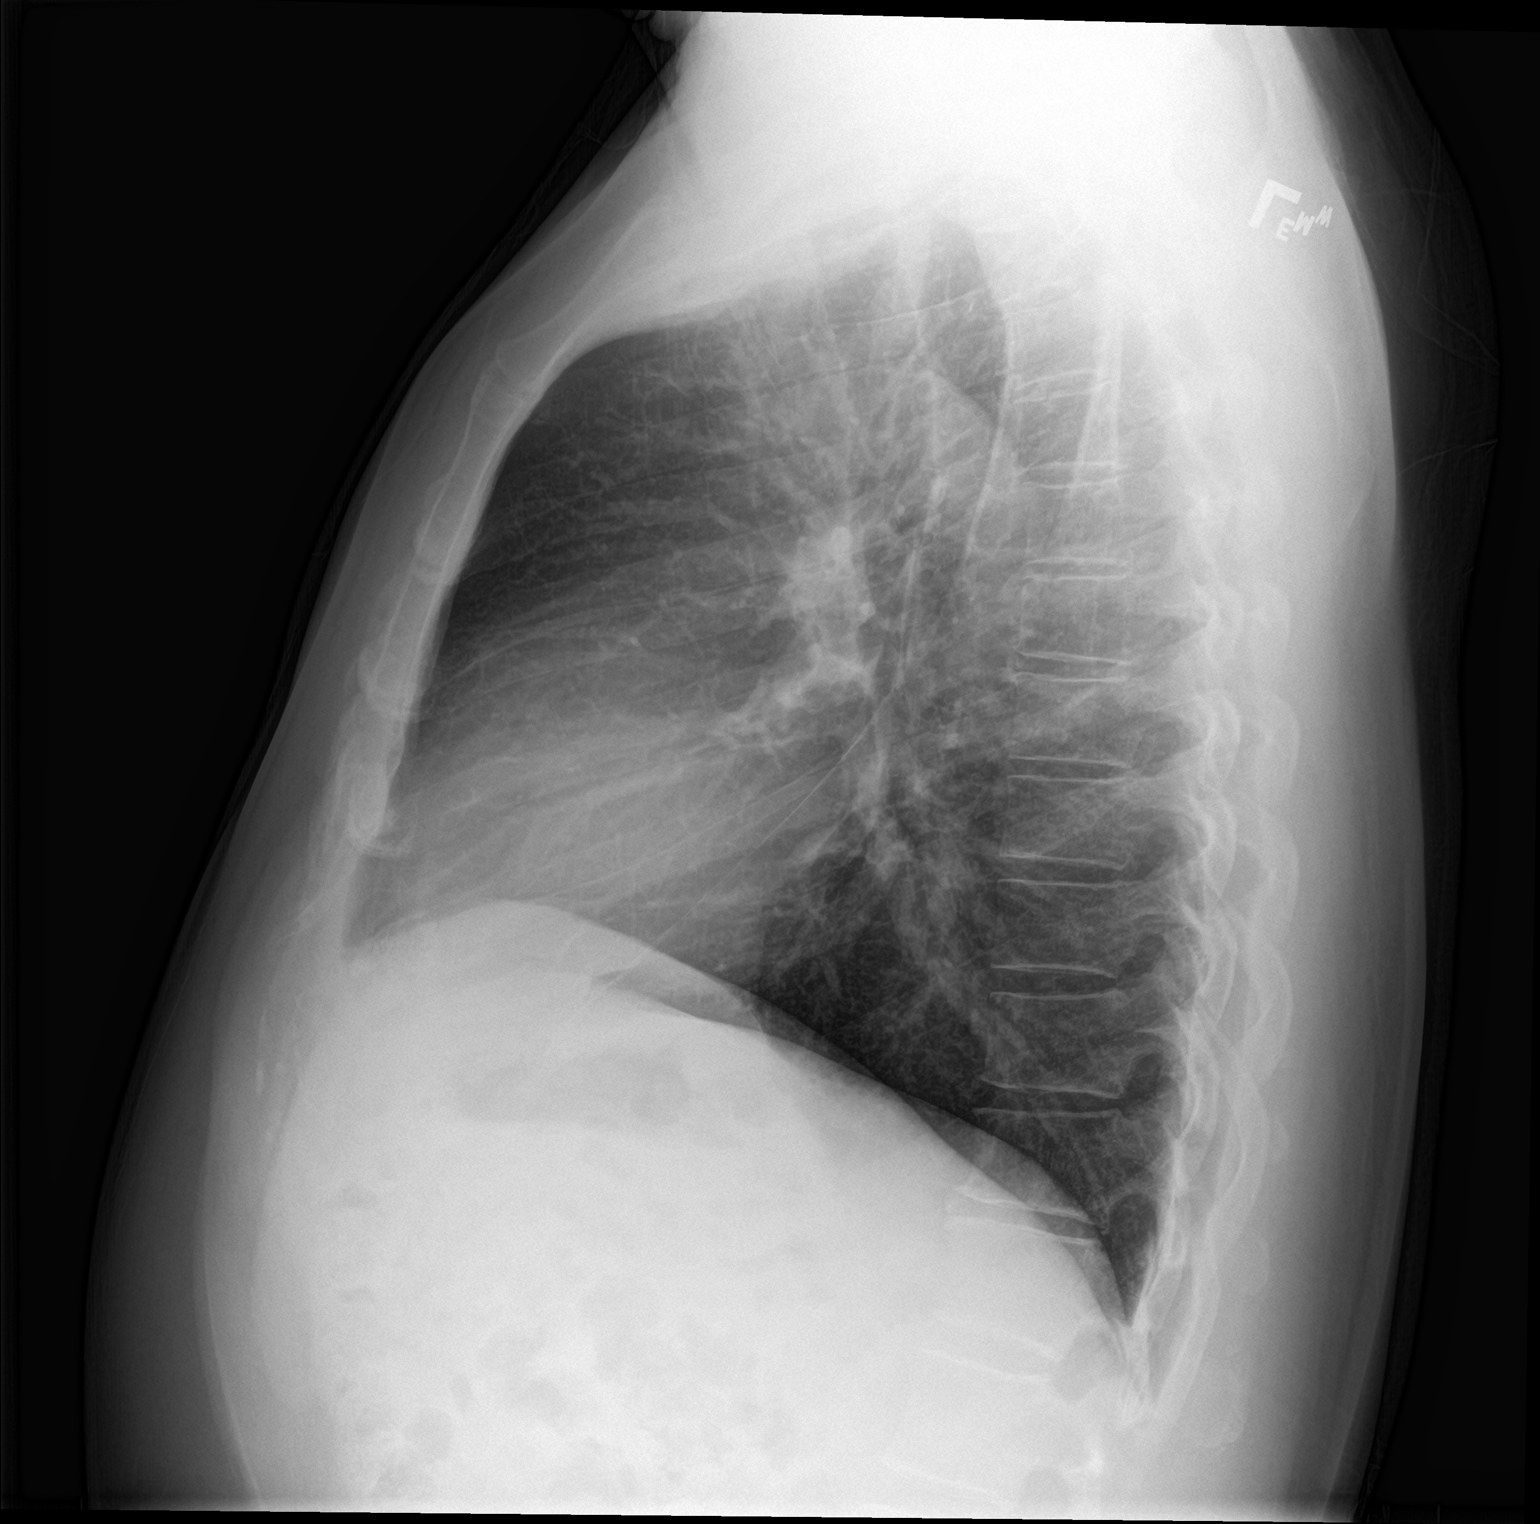

[2 of 2 positions shown; findings below may reference images not displayed]

FINDINGS: The heart size and mediastinal contours are within normal limits.
Both lungs are clear. The visualized skeletal structures are
unremarkable.
IMPRESSION: No active cardiopulmonary disease.

## 2017-10-10 LAB — HM DIABETES EYE EXAM

## 2017-10-13 ENCOUNTER — Encounter: Payer: Self-pay | Admitting: Internal Medicine

## 2017-10-16 ENCOUNTER — Other Ambulatory Visit: Payer: Self-pay | Admitting: Internal Medicine

## 2017-10-16 MED ORDER — ATORVASTATIN CALCIUM 20 MG PO TABS: 20.0000 mg | ORAL_TABLET | Freq: Every day | ORAL | 2 refills | Status: DC

## 2017-10-17 ENCOUNTER — Other Ambulatory Visit: Payer: BC Managed Care – PPO

## 2017-10-17 ENCOUNTER — Other Ambulatory Visit (INDEPENDENT_AMBULATORY_CARE_PROVIDER_SITE_OTHER): Payer: BC Managed Care – PPO

## 2017-10-17 DIAGNOSIS — E785 Hyperlipidemia, unspecified: Secondary | ICD-10-CM

## 2017-10-17 LAB — LIPID PANEL
Cholesterol: 115 mg/dL (ref 0–200)
HDL: 39.8 mg/dL (ref >39.00)
LDL Cholesterol: 58 mg/dL (ref 0–99)
NonHDL: 75.02
Total CHOL/HDL Ratio: 3
Triglycerides: 87 mg/dL (ref 0.0–149.0)
VLDL: 17.4 mg/dL (ref 0.0–40.0)

## 2017-10-17 LAB — AST: AST: 35 U/L (ref 0–37)

## 2017-10-17 LAB — ALT: ALT: 50 U/L (ref 0–53)

## 2017-10-17 MED FILL — GABAPENTIN 300 MG CAPSULE: 300 | 30 days supply | Qty: 30 | Fill #2

## 2017-10-24 ENCOUNTER — Encounter: Payer: Self-pay | Admitting: Internal Medicine

## 2017-11-24 ENCOUNTER — Ambulatory Visit (INDEPENDENT_AMBULATORY_CARE_PROVIDER_SITE_OTHER): Payer: BC Managed Care – PPO | Admitting: Internal Medicine

## 2017-11-24 ENCOUNTER — Encounter: Payer: Self-pay | Admitting: Internal Medicine

## 2017-11-24 VITALS — BP 116/68 | HR 69 | Temp 98.1°F | Resp 16 | Ht 71.0 in | Wt 192.1 lb

## 2017-11-24 DIAGNOSIS — Z0000 Encounter for general adult medical examination without abnormal findings: Secondary | ICD-10-CM | POA: Diagnosis not present

## 2017-11-24 LAB — CBC WITH DIFFERENTIAL/PLATELET
Basophils Absolute: 0 10*3/uL (ref 0.0–0.1)
Basophils Relative: 0.5 % (ref 0.0–3.0)
Eosinophils Absolute: 0.2 10*3/uL (ref 0.0–0.7)
Eosinophils Relative: 3.3 % (ref 0.0–5.0)
HCT: 44.4 % (ref 39.0–52.0)
Hemoglobin: 15.4 g/dL (ref 13.0–17.0)
Lymphocytes Relative: 45.6 % (ref 12.0–46.0)
Lymphs Abs: 2.6 10*3/uL (ref 0.7–4.0)
MCHC: 34.8 g/dL (ref 30.0–36.0)
MCV: 88.3 fl (ref 78.0–100.0)
Monocytes Absolute: 0.4 10*3/uL (ref 0.1–1.0)
Monocytes Relative: 6.9 % (ref 3.0–12.0)
Neutro Abs: 2.4 10*3/uL (ref 1.4–7.7)
Neutrophils Relative %: 43.7 % (ref 43.0–77.0)
Platelets: 225 10*3/uL (ref 150.0–400.0)
RBC: 5.03 Mil/uL (ref 4.22–5.81)
RDW: 13.1 % (ref 11.5–15.5)
WBC: 5.6 10*3/uL (ref 4.0–10.5)

## 2017-11-24 LAB — BASIC METABOLIC PANEL
BUN: 17 mg/dL (ref 6–23)
CO2: 29 mEq/L (ref 19–32)
Calcium: 9.1 mg/dL (ref 8.4–10.5)
Chloride: 104 mEq/L (ref 96–112)
Creatinine, Ser: 0.93 mg/dL (ref 0.40–1.50)
GFR: 89.96 mL/min (ref >60.00)
Glucose, Bld: 117 mg/dL — ABNORMAL HIGH (ref 70–99)
Potassium: 4.6 mEq/L (ref 3.5–5.1)
Sodium: 138 mEq/L (ref 135–145)

## 2017-11-24 LAB — TSH: TSH: 2.34 u[IU]/mL (ref 0.35–4.50)

## 2017-11-24 LAB — HEMOGLOBIN A1C: Hgb A1c MFr Bld: 6.3 % (ref 4.6–6.5)

## 2017-11-24 MED FILL — GABAPENTIN 300 MG CAPSULE: 300 | 30 days supply | Qty: 30 | Fill #3

## 2017-11-24 NOTE — Progress Notes (Signed)
Pre visit review using our clinic review tool, if applicable. No additional management support is needed unless otherwise documented below in the visit note. 

## 2017-11-24 NOTE — Progress Notes (Signed)
Subjective:    Patient ID: Samuel Rodriguez, male    DOB: May 03, 1965, 52 y.o.   MRN: [id number]  DOS:  11/24/2017 Type of visit - description : cpx Interval history: Good compliance of medications, reports tinnitus.  It has increased gradually over the last year, worse with anxiety, worse at night, no HOH, no vertigo.   Review of Systems  Other than above, a 14 point review of systems is negative    Past Medical History:  Diagnosis Date  . Chronic back pain    Dr. Saintclair Halsted, "chipped disc"  . Diabetes mellitus ([place])   . History of chicken pox     Past Surgical History:  Procedure Laterality Date  . NO PAST SURGERIES      Social History   Socioeconomic History  . Marital status: Single    Spouse name: Not on file  . Number of children: 0  . Years of education: Not on file  . Highest education level: Not on file  Occupational History  . Occupation: Education officer, museum  Social Needs  . Financial resource strain: Not on file  . Food insecurity:    Worry: Not on file    Inability: Not on file  . Transportation needs:    Medical: Not on file    Non-medical: Not on file  Tobacco Use  . Smoking status: Never Smoker  . Smokeless tobacco: Never Used  Substance and Sexual Activity  . Alcohol use: Yes    Alcohol/week: 0.0 standard drinks    Comment: Glass of wine 4-5 times per week.  Heavy drinking for 10 years (ages [age])  . Drug use: No  . Sexual activity: Not on file  Lifestyle  . Physical activity:    Days per week: Not on file    Minutes per session: Not on file  . Stress: Not on file  Relationships  . Social connections:    Talks on phone: Not on file    Gets together: Not on file    Attends religious service: Not on file    Active member of club or organization: Not on file    Attends meetings of clubs or organizations: Not on file    Relationship status: Not on file  . Intimate partner violence:    Fear of current or ex partner: Not on file    Emotionally  abused: Not on file    Physically abused: Not on file    Forced sexual activity: Not on file  Other Topics Concern  . Not on file  Social History Narrative   Lives by himself in a condominium.  No children.     Works as a Pharmacist, hospital.  Education: Masters   From  [place]      Family History  Problem Relation Age of Onset  . Pneumonia Mother        Deceased, 70  . Cirrhosis Father        Deceased  . Diabetes Brother   . CAD Neg Hx   . Colon cancer Neg Hx   . Prostate cancer Neg Hx     Allergies as of 11/24/2017   No Known Allergies     Medication List        Accurate as of 11/24/17  8:56 PM. Always use your most recent med list.          atorvastatin 20 MG tablet Commonly known as:  LIPITOR Take 1 tablet (20 mg total) by mouth at bedtime.  gabapentin 300 MG capsule Commonly known as:  NEURONTIN Take 1 capsule (300 mg total) by mouth at bedtime.   glucose blood test strip Check blood sugar no more than twice daily   MAGNESIUM CITRATE PO Take by mouth.   metFORMIN 500 MG tablet Commonly known as:  GLUCOPHAGE Take 1 tablet (500 mg total) by mouth 2 (two) times daily with a meal.   multivitamin tablet Take 1 tablet by mouth daily.   onetouch ultrasoft lancets Check blood sugar no more than twice daily.          Objective:   Physical Exam BP 116/68 (BP Location: Left Arm, Patient Position: Sitting, Cuff Size: Normal)   Pulse 69   Temp 98.1 F (36.7 C) (Oral)   Resp 16   Ht '5\' 11"'  (1.803 m)   Wt 192 lb 2 oz (87.1 kg)   SpO2 98%   BMI 26.80 kg/m       General: Well developed, NAD, see BMI.  Neck: No  thyromegaly  HEENT:  Normocephalic . Face symmetric, atraumatic.  TMs normal. Lungs:  CTA B Normal respiratory effort, no intercostal retractions, no accessory muscle use. Heart: RRR,  no murmur.  No pretibial edema bilaterally  Abdomen:  Not distended, soft, non-tender. No rebound or rigidity.   Skin: Exposed areas without rash. Not pale. Not  jaundice Neurologic:  alert & oriented X3.  Speech normal, gait appropriate for age and unassisted Strength symmetric and appropriate for age.  Psych: Cognition and judgment appear intact.  Cooperative with normal attention span and concentration.  Behavior appropriate. No anxious or depressed appearing.  Assessment & Sam plan:   Assessment: DM A1C 6.3 (04-2015) Dyslipidemia Subacute sensorimotor polyneuropathy, demyelinating-slowing and axon loss?  Likely d/t DM.  W/U 2017:Normal CSF. Normal ESR, SSA/B, copper, SPEP with IFE, vitamin B1, TSH, vitamin B12.  Follow clinically, may need to repeat NCS/EMG if symptoms worsen Benign fasciculations  Neuro 2017: could be related to foraminal stenosis since he has both L4 and L5 nerve impingement previously seen on MRI. Repeat MRI lumbar spine if he develops new weakness or pain Back pain  11-2013, MRI, saw Dr Saintclair Halsted, Left L3-L4 problem, was rx surgery, only did PT, strength returned w/ PT Syncope 2 , 2017: saw cards, "cough mediated", pt didn't pursue a echo  PLAN DM: On metformin, room for improvement on exercise, plans to do that.  Check a A1c Dyslipidemia: Currently well controlled on Lipitor Polyneuropathy: On gabapentin, good results. Tinnitus: Going on for 2 years, gradually more noticeable, no HOH.  Recommend mask symptoms with background music, we talk about possibly further eval but we agreed on observation for now since the symptoms are not severe. RTC 6 months

## 2017-11-24 NOTE — Assessment & Plan Note (Addendum)
-  td 2017; pnm 23: 2017;  Had a flu shot  -[facility]: interested in a cscope, previous referral failed,  patient is now extremely busy and will be a challenge to schedule the procedure, we agreed to do an IFOB this year. -prostate ca screening: DRE - PSA wnl 2018 -Diet: doing ok ; less time to exercise, plans to do more  -Labs: BMP, CBC, A1c, TSH

## 2017-11-24 NOTE — Assessment & Plan Note (Signed)
DM: On metformin, room for improvement on exercise, plans to do that.  Check a A1c Dyslipidemia: Currently well controlled on Lipitor Polyneuropathy: On gabapentin, good results. Tinnitus: Going on for 2 years, gradually more noticeable, no HOH.  Recommend mask symptoms with background music, we talk about possibly further eval but we agreed on observation for now since the symptoms are not severe. RTC 6 months

## 2017-11-24 NOTE — Patient Instructions (Signed)
GO TO THE LAB : Get the blood work     GO TO THE FRONT DESK Schedule your next appointment for a  Routine visit in 6 months

## 2018-01-11 ENCOUNTER — Other Ambulatory Visit: Payer: Self-pay | Admitting: Internal Medicine

## 2018-01-17 ENCOUNTER — Other Ambulatory Visit: Payer: Self-pay | Admitting: Internal Medicine

## 2018-01-20 ENCOUNTER — Encounter: Payer: Self-pay | Admitting: Internal Medicine

## 2018-01-21 MED ORDER — GABAPENTIN 300 MG PO CAPS: 300.0000 mg | ORAL_CAPSULE | Freq: Every day | ORAL | 6 refills | Status: DC

## 2018-03-11 ENCOUNTER — Encounter: Payer: Self-pay | Admitting: Internal Medicine

## 2018-03-24 ENCOUNTER — Ambulatory Visit: Payer: BC Managed Care – PPO | Admitting: Internal Medicine

## 2018-03-24 ENCOUNTER — Encounter: Payer: Self-pay | Admitting: Internal Medicine

## 2018-03-24 VITALS — BP 126/68 | HR 69 | Temp 98.1°F | Resp 16 | Ht 71.0 in | Wt 188.2 lb

## 2018-03-24 DIAGNOSIS — M545 Low back pain, unspecified: Secondary | ICD-10-CM

## 2018-03-24 MED ORDER — PREDNISONE 10 MG PO TABS: ORAL_TABLET | ORAL | 0 refills | Status: DC

## 2018-03-24 MED ORDER — CYCLOBENZAPRINE HCL 10 MG PO TABS: 10.0000 mg | ORAL_TABLET | Freq: Every evening | ORAL | 0 refills | Status: DC | PRN

## 2018-03-24 MED FILL — CYCLOBENZAPRINE HCL 10 MG T: 10 | 21 days supply | Qty: 21 | Fill #0

## 2018-03-24 MED FILL — predniSONE 10 MG TABS: 10 | 8 days supply | Qty: 20 | Fill #0

## 2018-03-24 NOTE — Progress Notes (Signed)
Subjective:    Patient ID: Samuel Rodriguez, male    DOB: [birth year], 53 y.o.   MRN: [id number]  DOS:  03/24/2018 Type of visit - description: Acute visit 2 days ago, he was helping a friend moving some heavy furniture. Immediately after he developed a very mild bilateral low back pain, it gradually worsen over the next following hours. The next day, the pain was moderate to intense, he could not extend his back all the way. Has not been able to work the last 2 days, he is a Pharmacist, hospital. Although this is the first time he has severe pain, in the last several months he has noticed mild on and off back pain.   Review of Systems No fever, no rash, no bladder or bowel incontinence. No paresthesias  Past Medical History:  Diagnosis Date  . Chronic back pain    Dr. Saintclair Halsted, "chipped disc"  . Diabetes mellitus ([place])   . History of chicken pox     Past Surgical History:  Procedure Laterality Date  . NO PAST SURGERIES      Social History   Socioeconomic History  . Marital status: Single    Spouse name: Not on file  . Number of children: 0  . Years of education: Not on file  . Highest education level: Not on file  Occupational History  . Occupation: Education officer, museum  Social Needs  . Financial resource strain: Not on file  . Food insecurity:    Worry: Not on file    Inability: Not on file  . Transportation needs:    Medical: Not on file    Non-medical: Not on file  Tobacco Use  . Smoking status: Never Smoker  . Smokeless tobacco: Never Used  Substance and Sexual Activity  . Alcohol use: Yes    Alcohol/week: 0.0 standard drinks    Comment: Glass of wine 4-5 times per week.  Heavy drinking for 10 years (ages [age])  . Drug use: No  . Sexual activity: Not on file  Lifestyle  . Physical activity:    Days per week: Not on file    Minutes per session: Not on file  . Stress: Not on file  Relationships  . Social connections:    Talks on phone: Not on file    Gets together: Not  on file    Attends religious service: Not on file    Active member of club or organization: Not on file    Attends meetings of clubs or organizations: Not on file    Relationship status: Not on file  . Intimate partner violence:    Fear of current or ex partner: Not on file    Emotionally abused: Not on file    Physically abused: Not on file    Forced sexual activity: Not on file  Other Topics Concern  . Not on file  Social History Narrative   Lives by himself in a condominium.  No children.     Works as a Pharmacist, hospital.  Education: Masters   From  [place]       Allergies as of 03/24/2018   No Known Allergies     Medication List       Accurate as of March 24, 2018  4:09 PM. Always use your most recent med list.        atorvastatin 20 MG tablet Commonly known as:  LIPITOR Take 1 tablet (20 mg total) by mouth at bedtime.   gabapentin 300 MG capsule  Commonly known as:  NEURONTIN Take 1 capsule (300 mg total) by mouth at bedtime.   glucose blood test strip Check blood sugar no more than twice daily   MAGNESIUM CITRATE PO Take by mouth.   metFORMIN 500 MG tablet Commonly known as:  GLUCOPHAGE Take 1 tablet (500 mg total) by mouth 2 (two) times daily with a meal.   multivitamin tablet Take 1 tablet by mouth daily.   onetouch ultrasoft lancets Check blood sugar no more than twice daily.           Objective:   Physical Exam BP 126/68 (BP Location: Left Arm, Patient Position: Sitting, Cuff Size: Normal)   Pulse 69   Temp 98.1 F (36.7 C) (Oral)   Resp 16   Ht '5\' 11"'  (1.803 m)   Wt 188 lb 4 oz (85.4 kg)   SpO2 98%   BMI 26.26 kg/m  General:   Well developed, NAD, BMI noted. HEENT:  Normocephalic . Face symmetric, atraumatic MSK: No TTP at the thoracolumbar spine or SI joints. Skin: Not pale. Not jaundice Neurologic:  alert & oriented X3.  Speech normal, gait is not antalgic however + antalgic position when he got onto the examining table DTRs  symmetric. Straight leg test: Negative, did report mild pain radiating to the left buttock when I elevated the left leg. Psych--  Cognition and judgment appear intact.  Cooperative with normal attention span and concentration.  Behavior appropriate. No anxious or depressed appearing.      Assessment     Assessment: DM A1C 6.3 (04-2015) Dyslipidemia Subacute sensorimotor polyneuropathy, demyelinating-slowing and axon loss?  Likely d/t DM.  W/U 2017:Normal CSF. Normal ESR, SSA/B, copper, SPEP with IFE, vitamin B1, TSH, vitamin B12.  Follow clinically, may need to repeat NCS/EMG if symptoms worsen Benign fasciculations  Neuro 2017: could be related to foraminal stenosis since he has both L4 and L5 nerve impingement previously seen on MRI. Repeat MRI lumbar spine if he develops new weakness or pain Back pain  11-2013, MRI, saw Dr Saintclair Halsted, Left L3-L4 problem, was rx surgery, only did PT, strength returned w/ PT Syncope 2 , 2017: saw cards, "cough mediated", pt didn't pursue a echo  PLAN Lumbalgia: Acute, following heavy furniture movement.  Suspect a sprain. Recommend acute treatment with prednisone, Flexeril, Tylenol and a heating pad. Aware that prednisone may increase CBGs, if they are more than 200 to let me know. Once better, I recommend to proceed with physical therapy for core muscle strengthening b/c  he had some back  aches and pains in the last year.

## 2018-03-24 NOTE — Progress Notes (Signed)
Pre visit review using our clinic review tool, if applicable. No additional management support is needed unless otherwise documented below in the visit note. 

## 2018-03-24 NOTE — Patient Instructions (Signed)
Take prednisone as prescribed for few days, this is an anti-inflammatory  Tylenol  500 mg OTC 2 tabs a day every 8 hours as needed for pain  Also Flexeril, a muscle relaxant at nighttime only.  Will cause drowsiness  Heating pad  No heavy lifting  Okay to go back to work as soon as you feel you are ready  We are referring you to physical therapy, please start the program no sooner than 2 weeks.  The goal of therapy is to increase the strength of your core muscles.

## 2018-03-25 NOTE — Assessment & Plan Note (Signed)
Lumbalgia: Acute, following heavy furniture movement.  Suspect a sprain. Recommend acute treatment with prednisone, Flexeril, Tylenol and a heating pad. Aware that prednisone may increase CBGs, if they are more than 200 to let me know. Once better, I recommend to proceed with physical therapy for core muscle strengthening b/c  he had some back  aches and pains in the last year.

## 2018-04-01 ENCOUNTER — Ambulatory Visit: Payer: BC Managed Care – PPO | Admitting: Physical Therapy

## 2018-04-06 ENCOUNTER — Ambulatory Visit: Payer: BC Managed Care – PPO | Admitting: Physical Therapy

## 2018-04-25 IMAGING — DX DG CHEST 2V
2 series · 2 of 2 positions shown · non-contrast
Comparison: 11/10/2015 chest radiograph

CLINICAL DATA: Cough for 6 weeks. Fever for 1 week. Chest pain with
deep inhalation.

EXAM:
CHEST  2 VIEW

[chest pa]
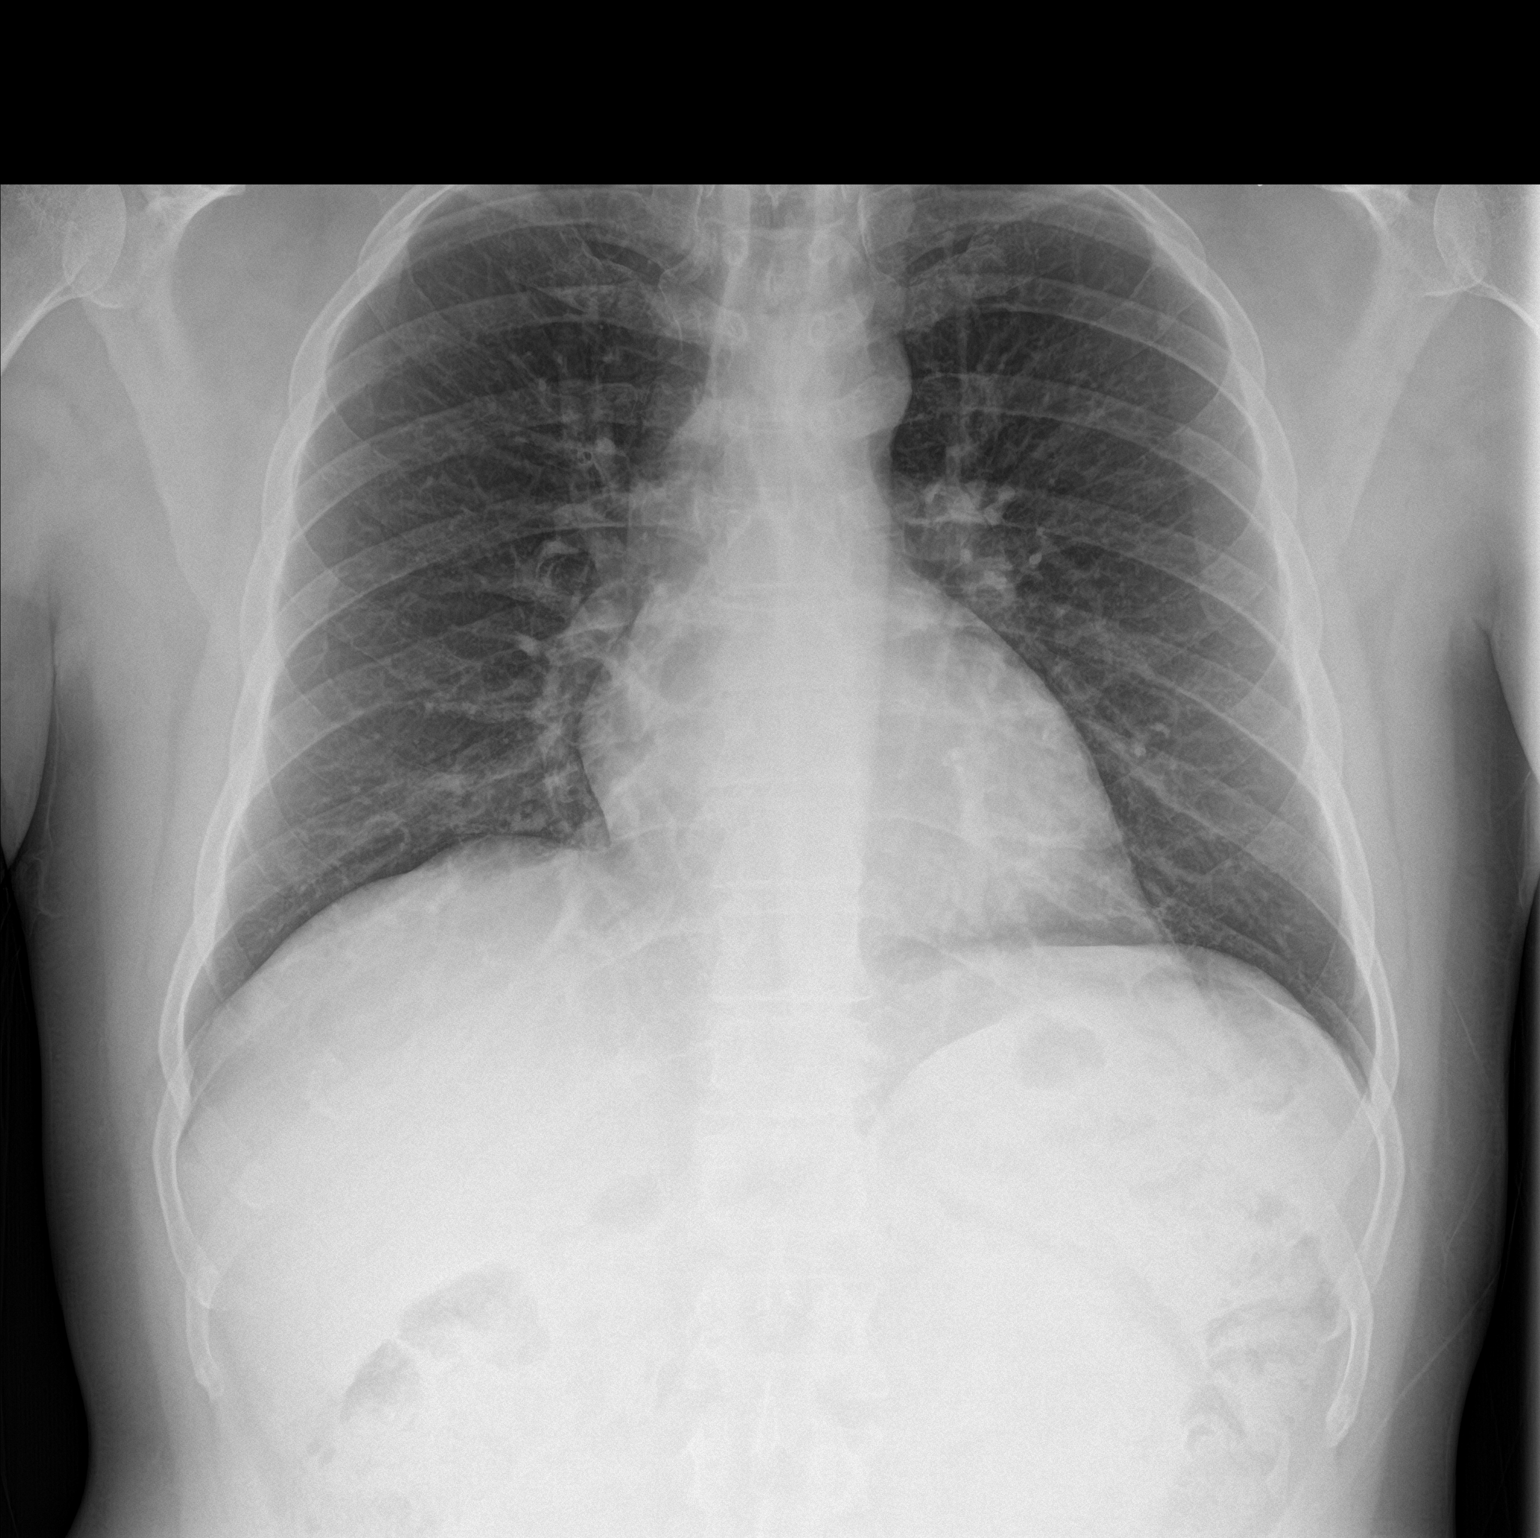

[chest lat]
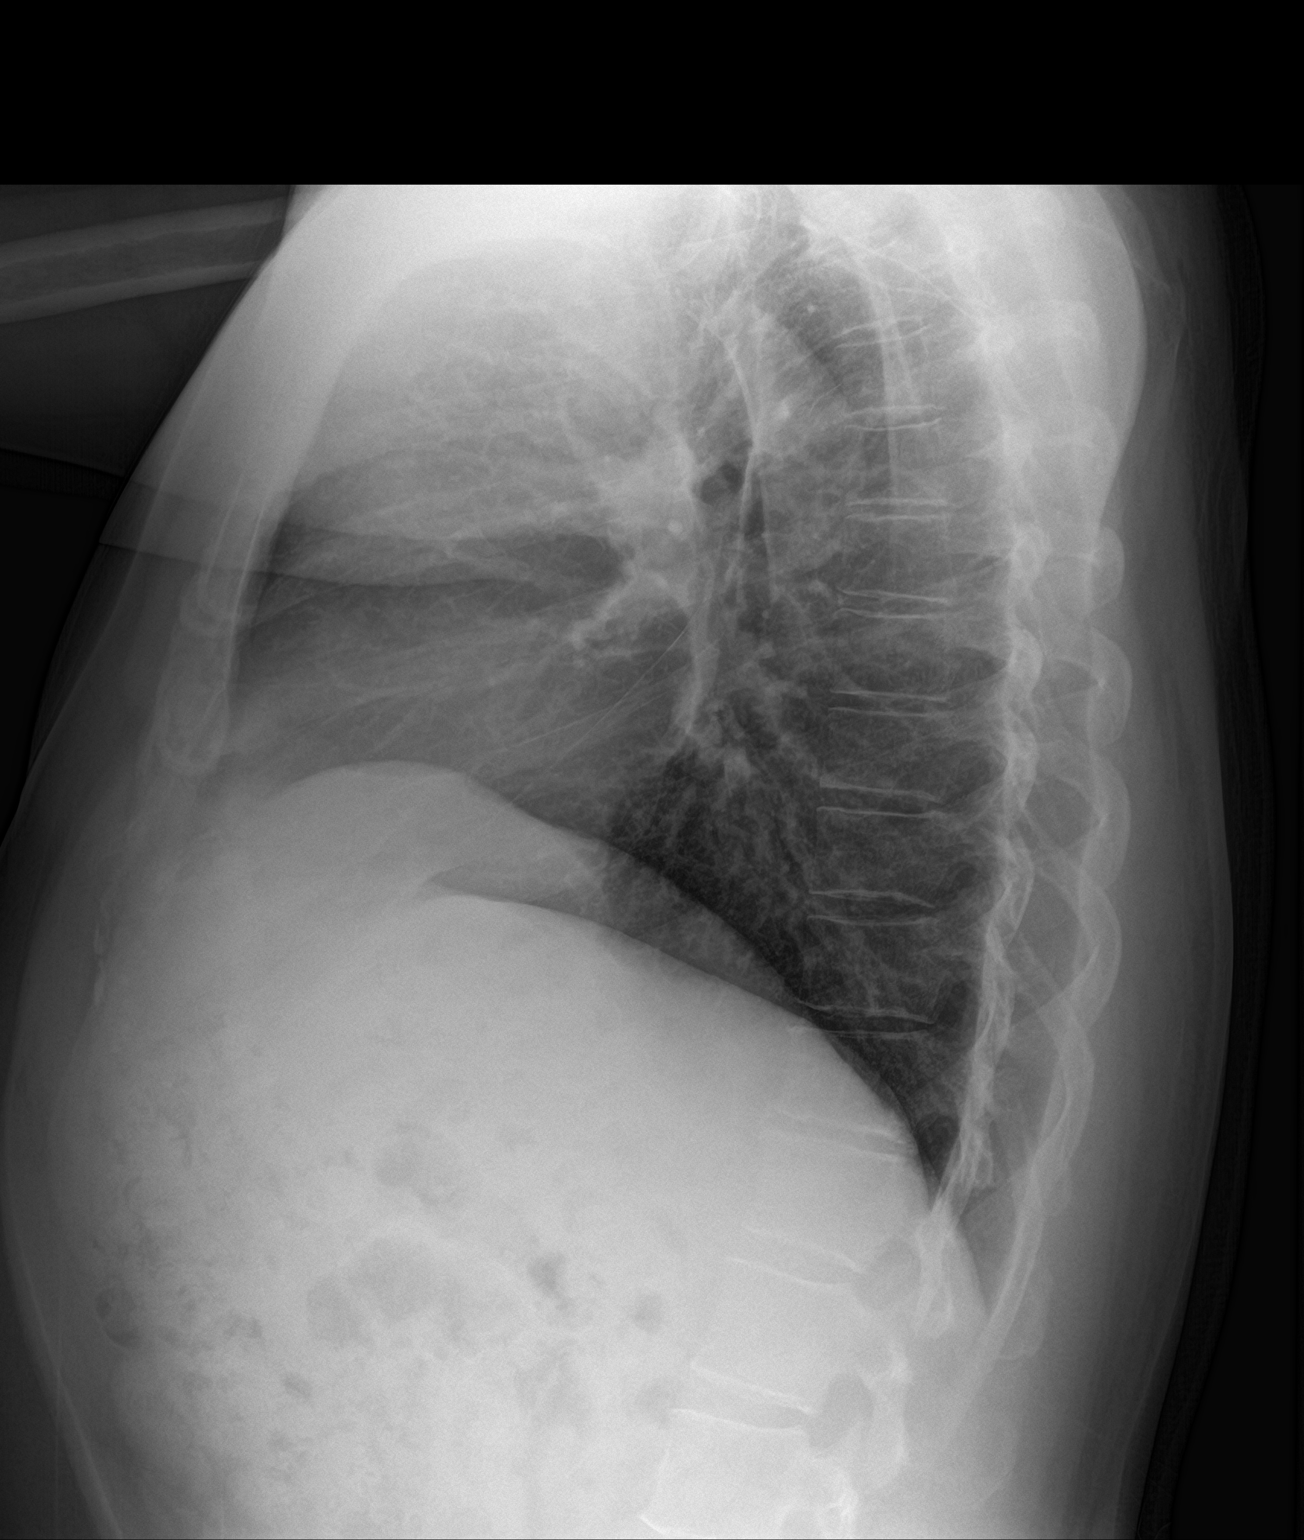

[2 of 2 positions shown; findings below may reference images not displayed]

FINDINGS: The cardiomediastinal silhouette is unremarkable.

There is no evidence of focal airspace disease, pulmonary edema,
suspicious pulmonary nodule/mass, pleural effusion, or pneumothorax.
No acute bony abnormalities are identified.
IMPRESSION: No active cardiopulmonary disease.

## 2018-07-15 ENCOUNTER — Other Ambulatory Visit: Payer: Self-pay | Admitting: Internal Medicine

## 2018-07-31 LAB — HM DIABETES EYE EXAM

## 2018-08-07 ENCOUNTER — Encounter: Payer: Self-pay | Admitting: Internal Medicine

## 2018-08-11 ENCOUNTER — Encounter: Payer: Self-pay | Admitting: Internal Medicine

## 2018-08-11 MED ORDER — METFORMIN HCL 500 MG PO TABS: 500.0000 mg | ORAL_TABLET | Freq: Two times a day (BID) | ORAL | 0 refills | Status: DC

## 2018-08-14 ENCOUNTER — Ambulatory Visit: Payer: BC Managed Care – PPO | Admitting: Internal Medicine

## 2018-08-14 ENCOUNTER — Other Ambulatory Visit: Payer: Self-pay

## 2018-08-14 ENCOUNTER — Encounter: Payer: Self-pay | Admitting: Internal Medicine

## 2018-08-14 VITALS — BP 125/89 | HR 79 | Temp 97.9°F | Resp 16 | Ht 71.0 in | Wt 190.2 lb

## 2018-08-14 DIAGNOSIS — G629 Polyneuropathy, unspecified: Secondary | ICD-10-CM

## 2018-08-14 DIAGNOSIS — E119 Type 2 diabetes mellitus without complications: Secondary | ICD-10-CM

## 2018-08-14 DIAGNOSIS — E785 Hyperlipidemia, unspecified: Secondary | ICD-10-CM | POA: Diagnosis not present

## 2018-08-14 LAB — MICROALBUMIN / CREATININE URINE RATIO
Creatinine,U: 145.6 mg/dL
Microalb Creat Ratio: 0.5 mg/g (ref 0.0–30.0)
Microalb, Ur: <0.7 mg/dL (ref 0.0–1.9)

## 2018-08-14 LAB — BASIC METABOLIC PANEL
BUN: 16 mg/dL (ref 6–23)
CO2: 27 mEq/L (ref 19–32)
Calcium: 9.5 mg/dL (ref 8.4–10.5)
Chloride: 102 mEq/L (ref 96–112)
Creatinine, Ser: 0.91 mg/dL (ref 0.40–1.50)
GFR: 86.56 mL/min (ref >60.00)
Glucose, Bld: 124 mg/dL — ABNORMAL HIGH (ref 70–99)
Potassium: 4.4 mEq/L (ref 3.5–5.1)
Sodium: 139 mEq/L (ref 135–145)

## 2018-08-14 LAB — LIPID PANEL
Cholesterol: 117 mg/dL (ref 0–200)
HDL: 39.6 mg/dL (ref >39.00)
LDL Cholesterol: 61 mg/dL (ref 0–99)
NonHDL: 77.7
Total CHOL/HDL Ratio: 3
Triglycerides: 86 mg/dL (ref 0.0–149.0)
VLDL: 17.2 mg/dL (ref 0.0–40.0)

## 2018-08-14 LAB — HEMOGLOBIN A1C: Hgb A1c MFr Bld: 6.6 % — ABNORMAL HIGH (ref 4.6–6.5)

## 2018-08-14 LAB — AST: AST: 37 U/L (ref 0–37)

## 2018-08-14 LAB — ALT: ALT: 54 U/L — ABNORMAL HIGH (ref 0–53)

## 2018-08-14 MED ORDER — GABAPENTIN 600 MG PO TABS: 600.0000 mg | ORAL_TABLET | Freq: Every day | ORAL | 6 refills | Status: DC

## 2018-08-14 MED ORDER — ATORVASTATIN CALCIUM 20 MG PO TABS: 20.0000 mg | ORAL_TABLET | Freq: Every day | ORAL | 0 refills | Status: DC

## 2018-08-14 NOTE — Progress Notes (Signed)
Subjective:    Patient ID: Samuel Rodriguez, male    DOB: 18-Jul-1965, 53 y.o.   MRN: [id number]  DOS:  08/14/2018 Type of visit - description: Routine office visit In general feels well. DM: Due for labs High cholesterol, on atorvastatin, no apparent side effects Leg cramps: Chronic issue, currently taking gabapentin 300 mg without much help.  Describes symptoms as on and off cramps or spasms in different areas of the lower extremity distal from the knee, worse at night.  Review of Systems No fever chills No anxiety or depression No chest pain no difficulty breathing Following good quarantine rules, no recent viral syndrome symptoms  Past Medical History:  Diagnosis Date  . Chronic back pain    Dr. Saintclair Halsted, "chipped disc"  . Diabetes mellitus ([place])   . History of chicken pox     Past Surgical History:  Procedure Laterality Date  . NO PAST SURGERIES      Social History   Socioeconomic History  . Marital status: Single    Spouse name: Not on file  . Number of children: 0  . Years of education: Not on file  . Highest education level: Not on file  Occupational History  . Occupation: Education officer, museum  Social Needs  . Financial resource strain: Not on file  . Food insecurity    Worry: Not on file    Inability: Not on file  . Transportation needs    Medical: Not on file    Non-medical: Not on file  Tobacco Use  . Smoking status: Never Smoker  . Smokeless tobacco: Never Used  Substance and Sexual Activity  . Alcohol use: Yes    Alcohol/week: 0.0 standard drinks    Comment: Glass of wine 4-5 times per week.  Heavy drinking for 10 years (ages [age])  . Drug use: No  . Sexual activity: Not on file  Lifestyle  . Physical activity    Days per week: Not on file    Minutes per session: Not on file  . Stress: Not on file  Relationships  . Social Herbalist on phone: Not on file    Gets together: Not on file    Attends religious service: Not on file    Active  member of club or organization: Not on file    Attends meetings of clubs or organizations: Not on file    Relationship status: Not on file  . Intimate partner violence    Fear of current or ex partner: Not on file    Emotionally abused: Not on file    Physically abused: Not on file    Forced sexual activity: Not on file  Other Topics Concern  . Not on file  Social History Narrative   Lives by himself in a condominium.  No children.     Works as a Pharmacist, hospital.  Education: Masters   From  [place]       Allergies as of 08/14/2018   No Known Allergies     Medication List       Accurate as of August 14, 2018 11:59 PM. If you have any questions, ask your nurse or doctor.        STOP taking these medications   cyclobenzaprine 10 MG tablet Commonly known as: FLEXERIL Stopped by: Kathlene November, MD   gabapentin 300 MG capsule Commonly known as: NEURONTIN Replaced by: gabapentin 600 MG tablet Stopped by: Kathlene November, MD   MAGNESIUM CITRATE PO Stopped by:  Kathlene November, MD   predniSONE 10 MG tablet Commonly known as: DELTASONE Stopped by: Kathlene November, MD     TAKE these medications   atorvastatin 20 MG tablet Commonly known as: LIPITOR Take 1 tablet (20 mg total) by mouth at bedtime.   gabapentin 600 MG tablet Commonly known as: Neurontin Take 1 tablet (600 mg total) by mouth at bedtime. Replaces: gabapentin 300 MG capsule Started by: Kathlene November, MD   glucose blood test strip Check blood sugar no more than twice daily   metFORMIN 500 MG tablet Commonly known as: GLUCOPHAGE Take 1 tablet (500 mg total) by mouth 2 (two) times daily with a meal.   multivitamin tablet Take 1 tablet by mouth daily.   onetouch ultrasoft lancets Check blood sugar no more than twice daily.           Objective:   Physical Exam BP 125/89 (BP Location: Left Arm, Patient Position: Sitting, Cuff Size: Small)   Pulse 79   Temp 97.9 F (36.6 C) (Oral)   Resp 16   Ht '5\' 11"'  (1.803 m)   Wt 190 lb 4 oz  (86.3 kg)   SpO2 100%   BMI 26.53 kg/m  General:   Well developed, NAD, BMI noted. HEENT:  Normocephalic . Face symmetric, atraumatic Lungs:  CTA B Normal respiratory effort, no intercostal retractions, no accessory muscle use. Heart: RRR,  no murmur.  No pretibial edema bilaterally  Skin: Not pale. Not jaundice Neurologic:  alert & oriented X3.  Speech normal, gait appropriate for age and unassisted Psych--  Cognition and judgment appear intact.  Cooperative with normal attention span and concentration.  Behavior appropriate. No anxious or depressed appearing.      Assessment      Assessment: DM A1C 6.3 (04-2015) Dyslipidemia Subacute sensorimotor polyneuropathy, demyelinating-slowing and axon loss?  Likely d/t DM.  W/U 2017:Normal CSF. Normal ESR, SSA/B, copper, SPEP with IFE, vitamin B1, TSH, vitamin B12.  Follow clinically, may need to repeat NCS/EMG if symptoms worsen Benign fasciculations  Neuro 2017: could be related to foraminal stenosis since he has both L4 and L5 nerve impingement previously seen on MRI. Repeat MRI lumbar spine if he develops new weakness or pain Back pain  11-2013, MRI, saw Dr Saintclair Halsted, Left L3-L4 problem, was rx surgery, only did PT, strength returned w/ PT Syncope 2 , 2017: saw cards, "cough mediated", pt didn't pursue a echo  PLAN DM: On metformin, check a BMP, A1c. Dyslipidemia: On atorvastatin, check a FLP and LFTs Neuropathy: Ongoing problems with "leg cramps", distal from the knee, worse at night.  On gabapentin 300 mg without much help. Plan: Increase gabapentin to 600 mg, recommend tonic water half bottle at night. Preventive care: Recommend flu shot earlier this season RTC 11-2018 CPX

## 2018-08-14 NOTE — Patient Instructions (Signed)
Get the blood work     [place] Schedule your next appointment for a physical exam by October  For leg cramps: Increase gabapentin to 600 mg at night  Take half bottle of  diet tonic water every night

## 2018-08-14 NOTE — Progress Notes (Signed)
Pre visit review using our clinic review tool, if applicable. No additional management support is needed unless otherwise documented below in the visit note. 

## 2018-08-16 NOTE — Assessment & Plan Note (Signed)
DM: On metformin, check a BMP, A1c. Dyslipidemia: On atorvastatin, check a FLP and LFTs Neuropathy: Ongoing problems with "leg cramps", distal from the knee, worse at night.  On gabapentin 300 mg without much help. Plan: Increase gabapentin to 600 mg, recommend tonic water half bottle at night. Preventive care: Recommend flu shot earlier this season RTC 11-2018 CPX

## 2018-11-05 ENCOUNTER — Other Ambulatory Visit: Payer: Self-pay | Admitting: Internal Medicine

## 2018-12-02 ENCOUNTER — Encounter: Payer: Self-pay | Admitting: Internal Medicine

## 2018-12-02 ENCOUNTER — Other Ambulatory Visit: Payer: Self-pay

## 2018-12-03 ENCOUNTER — Encounter: Payer: Self-pay | Admitting: Gastroenterology

## 2018-12-03 ENCOUNTER — Ambulatory Visit (INDEPENDENT_AMBULATORY_CARE_PROVIDER_SITE_OTHER): Payer: BC Managed Care – PPO | Admitting: Internal Medicine

## 2018-12-03 ENCOUNTER — Encounter: Payer: Self-pay | Admitting: Internal Medicine

## 2018-12-03 VITALS — BP 131/85 | HR 68 | Temp 97.1°F | Resp 16 | Ht 71.0 in | Wt 185.2 lb

## 2018-12-03 DIAGNOSIS — E119 Type 2 diabetes mellitus without complications: Secondary | ICD-10-CM

## 2018-12-03 DIAGNOSIS — R7989 Other specified abnormal findings of blood chemistry: Secondary | ICD-10-CM

## 2018-12-03 DIAGNOSIS — Z1211 Encounter for screening for malignant neoplasm of colon: Secondary | ICD-10-CM

## 2018-12-03 DIAGNOSIS — Z0000 Encounter for general adult medical examination without abnormal findings: Secondary | ICD-10-CM

## 2018-12-03 LAB — CBC WITH DIFFERENTIAL/PLATELET
Basophils Absolute: 0 10*3/uL (ref 0.0–0.1)
Basophils Relative: 0.2 % (ref 0.0–3.0)
Eosinophils Absolute: 0.1 10*3/uL (ref 0.0–0.7)
Eosinophils Relative: 1.7 % (ref 0.0–5.0)
HCT: 46.3 % (ref 39.0–52.0)
Hemoglobin: 15.7 g/dL (ref 13.0–17.0)
Lymphocytes Relative: 46.1 % — ABNORMAL HIGH (ref 12.0–46.0)
Lymphs Abs: 2.4 10*3/uL (ref 0.7–4.0)
MCHC: 33.9 g/dL (ref 30.0–36.0)
MCV: 88.8 fl (ref 78.0–100.0)
Monocytes Absolute: 0.4 10*3/uL (ref 0.1–1.0)
Monocytes Relative: 7.7 % (ref 3.0–12.0)
Neutro Abs: 2.3 10*3/uL (ref 1.4–7.7)
Neutrophils Relative %: 44.3 % (ref 43.0–77.0)
Platelets: 231 10*3/uL (ref 150.0–400.0)
RBC: 5.21 Mil/uL (ref 4.22–5.81)
RDW: 13.4 % (ref 11.5–15.5)
WBC: 5.2 10*3/uL (ref 4.0–10.5)

## 2018-12-03 LAB — COMPREHENSIVE METABOLIC PANEL
ALT: 58 U/L — ABNORMAL HIGH (ref 0–53)
AST: 44 U/L — ABNORMAL HIGH (ref 0–37)
Albumin: 4.4 g/dL (ref 3.5–5.2)
Alkaline Phosphatase: 64 U/L (ref 39–117)
BUN: 16 mg/dL (ref 6–23)
CO2: 31 mEq/L (ref 19–32)
Calcium: 9.6 mg/dL (ref 8.4–10.5)
Chloride: 101 mEq/L (ref 96–112)
Creatinine, Ser: 0.95 mg/dL (ref 0.40–1.50)
GFR: 82.27 mL/min (ref >60.00)
Glucose, Bld: 125 mg/dL — ABNORMAL HIGH (ref 70–99)
Potassium: 4.6 mEq/L (ref 3.5–5.1)
Sodium: 139 mEq/L (ref 135–145)
Total Bilirubin: 1.3 mg/dL — ABNORMAL HIGH (ref 0.2–1.2)
Total Protein: 6.7 g/dL (ref 6.0–8.3)

## 2018-12-03 LAB — HEMOGLOBIN A1C: Hgb A1c MFr Bld: 6.9 % — ABNORMAL HIGH (ref 4.6–6.5)

## 2018-12-03 LAB — PSA: PSA: 0.51 ng/mL (ref 0.10–4.00)

## 2018-12-03 NOTE — Assessment & Plan Note (Signed)
-  Td  2017 - pnm 23: 2017 - shingrix:d/w pt. Will wait for now - had a  flu shot  -CCS: interested in a cscope, refer to GI. -prostate ca screening: DRE normal, check a PSA  -Diet, exercise: Counseled:    -Labs:  CBC, CMP, A1c, hepatitis B and C serology (increased LFTs before), PSA

## 2018-12-03 NOTE — Progress Notes (Signed)
Pre visit review using our clinic review tool, if applicable. No additional management support is needed unless otherwise documented below in the visit note. 

## 2018-12-03 NOTE — Progress Notes (Signed)
Subjective:    Patient ID: Samuel Rodriguez, male    DOB: 12-25-1965, 53 y.o.   MRN: [id number]  DOS:  12/03/2018 Type of visit - description: CPX No major concerns   Review of Systems Neuropathy symptoms about the same, self discontinue gabapentin, no change in symptoms.  Other than above, a 14 point review of systems is negative     Past Medical History:  Diagnosis Date  . Chronic back pain    Dr. Saintclair Halsted, "chipped disc"  . Diabetes mellitus ([place])   . History of chicken pox     Past Surgical History:  Procedure Laterality Date  . NO PAST SURGERIES      Social History   Socioeconomic History  . Marital status: Single    Spouse name: Not on file  . Number of children: 0  . Years of education: Not on file  . Highest education level: Not on file  Occupational History  . Occupation: Education officer, museum  Social Needs  . Financial resource strain: Not on file  . Food insecurity    Worry: Not on file    Inability: Not on file  . Transportation needs    Medical: Not on file    Non-medical: Not on file  Tobacco Use  . Smoking status: Never Smoker  . Smokeless tobacco: Never Used  Substance and Sexual Activity  . Alcohol use: Yes    Alcohol/week: 0.0 standard drinks    Comment: Glass of wine 4-5 times per week.  Heavy drinking for 10 years (ages [age])  . Drug use: No  . Sexual activity: Not on file  Lifestyle  . Physical activity    Days per week: Not on file    Minutes per session: Not on file  . Stress: Not on file  Relationships  . Social Herbalist on phone: Not on file    Gets together: Not on file    Attends religious service: Not on file    Active member of club or organization: Not on file    Attends meetings of clubs or organizations: Not on file    Relationship status: Not on file  . Intimate partner violence    Fear of current or ex partner: Not on file    Emotionally abused: Not on file    Physically abused: Not on file    Forced sexual  activity: Not on file  Other Topics Concern  . Not on file  Social History Narrative   Lives by himself in a condominium.  No children.     Works as a Pharmacist, hospital.     Education: Masters   From  [place]    Family History  Problem Relation Age of Onset  . Pneumonia Mother        Deceased, 74  . Cirrhosis Father        Deceased  . Diabetes Brother   . CAD Neg Hx   . Colon cancer Neg Hx   . Prostate cancer Neg Hx        Allergies as of 12/03/2018   No Known Allergies     Medication List       Accurate as of December 03, 2018 11:59 PM. If you have any questions, ask your nurse or doctor.        STOP taking these medications   gabapentin 600 MG tablet Commonly known as: Neurontin Stopped by: Kathlene November, MD     TAKE these medications  atorvastatin 20 MG tablet Commonly known as: LIPITOR Take 1 tablet (20 mg total) by mouth at bedtime.   glucose blood test strip Check blood sugar no more than twice daily   metFORMIN 500 MG tablet Commonly known as: GLUCOPHAGE Take 1 tablet (500 mg total) by mouth 2 (two) times daily with a meal.   multivitamin tablet Take 1 tablet by mouth daily.   onetouch ultrasoft lancets Check blood sugar no more than twice daily.           Objective:   Physical Exam BP 131/85 (BP Location: Left Arm, Patient Position: Sitting, Cuff Size: Small)   Pulse 68   Temp (!) 97.1 F (36.2 C) (Temporal)   Resp 16   Ht _0  (1.803 m)   Wt 185 lb 4 oz (84 kg)   SpO2 100%   BMI 25.84 kg/m  General: Well developed, NAD, BMI noted Neck: No  thyromegaly  HEENT:  Normocephalic . Face symmetric, atraumatic Lungs:  CTA B Normal respiratory effort, no intercostal retractions, no accessory muscle use. Heart: RRR,  no murmur.  No pretibial edema bilaterally  Abdomen:  Not distended, soft, non-tender. No rebound or rigidity.   Skin: Exposed areas without rash. Not pale. Not jaundice DRE: Normal examination, no stools, no rectal mass.   Normal prostate Neurologic:  alert & oriented X3.  Speech normal, gait appropriate for age and unassisted Strength symmetric and appropriate for age.  Psych: Cognition and judgment appear intact.  Cooperative with normal attention span and concentration.  Behavior appropriate. No anxious or depressed appearing.     Assessment        Assessment: DM A1C 6.3 (04-2015) Dyslipidemia Subacute sensorimotor polyneuropathy, demyelinating-slowing and axon loss?  Likely d/t DM.  W/U 2017:Normal CSF. Normal ESR, SSA/B, copper, SPEP with IFE, vitamin B1, TSH, vitamin B12.  Follow clinically, may need to repeat NCS/EMG if symptoms worsen Benign fasciculations  Neuro 2017: could be related to foraminal stenosis since he has both L4 and L5 nerve impingement previously seen on MRI. Repeat MRI lumbar spine if he develops new weakness or pain Back pain  11-2013, MRI, saw Dr Saintclair Halsted, Left L3-L4 problem, was rx surgery, only did PT, strength returned w/ PT Syncope 2 , 2017: saw cards, "cough mediated", pt didn't pursue a echo  PLAN Here for CPX DM: On metformin, checking labs. High cholesterol: Well-controlled on atorvastatin Neuropathy: Self DC'd gabapentin, it was not helping.  Symptoms are about the same RTC 6 months

## 2018-12-03 NOTE — Patient Instructions (Signed)
GO TO THE LAB : Get the blood work     GO TO THE FRONT DESK Schedule your next appointment for a   checkup in 6 months 

## 2018-12-04 NOTE — Assessment & Plan Note (Signed)
Here for CPX DM: On metformin, checking labs. High cholesterol: Well-controlled on atorvastatin Neuropathy: Self DC'd gabapentin, it was not helping.  Symptoms are about the same RTC 6 months

## 2018-12-08 LAB — HEPATITIS B SURFACE ANTIGEN: Hepatitis B Surface Ag: NONREACTIVE

## 2018-12-08 LAB — HEPATITIS C ANTIBODY
Hepatitis C Ab: NONREACTIVE
SIGNAL TO CUT-OFF: 0.01 (ref <1.00)

## 2018-12-08 LAB — HEPATITIS B CORE ANTIBODY, TOTAL: Hep B Core Total Ab: NONREACTIVE

## 2018-12-08 LAB — HEPATITIS B SURFACE ANTIBODY,QUALITATIVE: Hep B S Ab: NONREACTIVE

## 2018-12-09 ENCOUNTER — Encounter: Payer: BC Managed Care – PPO | Admitting: Internal Medicine

## 2018-12-09 ENCOUNTER — Encounter: Payer: Self-pay | Admitting: Internal Medicine

## 2019-01-11 ENCOUNTER — Encounter: Payer: BC Managed Care – PPO | Admitting: Gastroenterology

## 2019-02-02 ENCOUNTER — Other Ambulatory Visit: Payer: Self-pay | Admitting: Internal Medicine

## 2019-06-04 ENCOUNTER — Encounter: Payer: BC Managed Care – PPO | Admitting: Internal Medicine

## 2019-06-11 ENCOUNTER — Other Ambulatory Visit: Payer: Self-pay

## 2019-06-11 ENCOUNTER — Ambulatory Visit (INDEPENDENT_AMBULATORY_CARE_PROVIDER_SITE_OTHER): Payer: BC Managed Care – PPO | Admitting: Internal Medicine

## 2019-06-11 VITALS — BP 115/75 | HR 73 | Temp 98.3°F | Resp 12 | Ht 71.0 in | Wt 187.8 lb

## 2019-06-11 DIAGNOSIS — R7989 Other specified abnormal findings of blood chemistry: Secondary | ICD-10-CM

## 2019-06-11 DIAGNOSIS — E785 Hyperlipidemia, unspecified: Secondary | ICD-10-CM | POA: Diagnosis not present

## 2019-06-11 DIAGNOSIS — F439 Reaction to severe stress, unspecified: Secondary | ICD-10-CM | POA: Diagnosis not present

## 2019-06-11 DIAGNOSIS — E119 Type 2 diabetes mellitus without complications: Secondary | ICD-10-CM | POA: Insufficient documentation

## 2019-06-11 LAB — COMPREHENSIVE METABOLIC PANEL
ALT: 43 U/L (ref 0–53)
AST: 32 U/L (ref 0–37)
Albumin: 4.4 g/dL (ref 3.5–5.2)
Alkaline Phosphatase: 62 U/L (ref 39–117)
BUN: 17 mg/dL (ref 6–23)
CO2: 31 mEq/L (ref 19–32)
Calcium: 8.9 mg/dL (ref 8.4–10.5)
Chloride: 102 mEq/L (ref 96–112)
Creatinine, Ser: 0.87 mg/dL (ref 0.40–1.50)
GFR: 90.89 mL/min (ref >60.00)
Glucose, Bld: 131 mg/dL — ABNORMAL HIGH (ref 70–99)
Potassium: 4.4 mEq/L (ref 3.5–5.1)
Sodium: 139 mEq/L (ref 135–145)
Total Bilirubin: 0.9 mg/dL (ref 0.2–1.2)
Total Protein: 6.7 g/dL (ref 6.0–8.3)

## 2019-06-11 LAB — LIPID PANEL
Cholesterol: 116 mg/dL (ref 0–200)
HDL: 39.7 mg/dL (ref >39.00)
LDL Cholesterol: 60 mg/dL (ref 0–99)
NonHDL: 76.06
Total CHOL/HDL Ratio: 3
Triglycerides: 78 mg/dL (ref 0.0–149.0)
VLDL: 15.6 mg/dL (ref 0.0–40.0)

## 2019-06-11 LAB — HEMOGLOBIN A1C: Hgb A1c MFr Bld: 6.7 % — ABNORMAL HIGH (ref 4.6–6.5)

## 2019-06-11 NOTE — Progress Notes (Signed)
Subjective:    Patient ID: Samuel Rodriguez, male    DOB: [birth year], 54 y.o.   MRN: [id number]  DOS:  06/11/2019 Type of visit - description: Follow-up Today we talk about : increased LFTs, diabetes, high cholesterol. He is very stressed, doing a Oceanographer.   Despite that he is managing to increase physical activity.  In his own scales he has lost some weight.  Wt Readings from Last 3 Encounters:  06/11/19 187 lb 12.8 oz (85.2 kg)  12/03/18 185 lb 4 oz (84 kg)  08/14/18 190 lb 4 oz (86.3 kg)     Review of Systems Denies nausea, vomiting, diarrhea.  Hardly ever drinks a glass of wine or takes Tylenol  Past Medical History:  Diagnosis Date  . Chronic back pain    Dr. Saintclair Halsted, "chipped disc"  . Diabetes mellitus ([place])   . History of chicken pox     Past Surgical History:  Procedure Laterality Date  . NO PAST SURGERIES      Allergies as of 06/11/2019   No Known Allergies     Medication List       Accurate as of June 11, 2019  8:47 AM. If you have any questions, ask your nurse or doctor.        atorvastatin 20 MG tablet Commonly known as: LIPITOR Take 1 tablet (20 mg total) by mouth at bedtime.   glucose blood test strip Check blood sugar no more than twice daily   metFORMIN 500 MG tablet Commonly known as: GLUCOPHAGE Take 1 tablet (500 mg total) by mouth 2 (two) times daily with a meal.   multivitamin tablet Take 1 tablet by mouth daily.   onetouch ultrasoft lancets Check blood sugar no more than twice daily.          Objective:   Physical Exam BP 115/75   Pulse 73   Temp 98.3 F (36.8 C) (Temporal)   Resp 12   Ht _0  (1.803 m)   Wt 187 lb 12.8 oz (85.2 kg)   SpO2 97%   BMI 26.19 kg/m  General:   Well developed, NAD, BMI noted. HEENT:  Normocephalic . Face symmetric, atraumatic Lungs:  CTA B Normal respiratory effort, no intercostal retractions, no accessory muscle use. Heart: RRR,  no murmur.  Lower extremities: no pretibial edema  bilaterally  Skin: Not pale. Not jaundice Neurologic:  alert & oriented X3.  Speech normal, gait appropriate for age and unassisted Psych--  Cognition and judgment appear intact.  Cooperative with normal attention span and concentration.  Behavior appropriate. No anxious or depressed appearing.      Assessment     Assessment: DM A1C 6.3 (04-2015) Dyslipidemia Subacute sensorimotor polyneuropathy, demyelinating-slowing and axon loss?  Likely d/t DM.  W/U 2017:Normal CSF. Normal ESR, SSA/B, copper, SPEP with IFE, vitamin B1, TSH, vitamin B12.  Follow clinically, may need to repeat NCS/EMG if symptoms worsen Benign fasciculations  Neuro 2017: could be related to foraminal stenosis since he has both L4 and L5 nerve impingement previously seen on MRI. Repeat MRI lumbar spine if he develops new weakness or pain Back pain  11-2013, MRI, saw Dr Saintclair Halsted, Left L3-L4 problem, was rx surgery, only did PT, strength returned w/ PT Syncope 2 , 2017: saw cards, "cough mediated", pt didn't pursue a echo Increase LFTs: Hep B, hep C -11-2018   PLAN DM: Continue Metformin, doing better with diet, increase exercise, check A1c. Dyslipidemia: On Lipitor, check a FLP Increase LFTs: Chart reviewed,  has been a modest increase in LFTs on and off since 2017 (where he established with this office), on Metformin since 2017, on atorvastatin since 2019.  Has a glass of wine or Tylenol rarely. Possibly fatty liver?Marland Kitchen  We will recheck LFTs today, US abdomen, iron panel. Stress: Listening therapy provided, managing well RTC 6 months CPX    This visit occurred during the SARS-CoV-2 public health emergency.  Safety protocols were in place, including screening questions prior to the visit, additional usage of staff PPE, and extensive cleaning of exam room while observing appropriate contact time as indicated for disinfecting solutions.

## 2019-06-11 NOTE — Patient Instructions (Signed)
   GO TO THE LAB : Get the blood work     GO TO THE FRONT DESK, PLEASE SCHEDULE YOUR APPOINTMENTS Come back for for a physical exam in 6 months  They will call you and schedule ultrasound of the abdomen in [facility]

## 2019-06-12 LAB — IRON,TIBC AND FERRITIN PANEL
%SAT: 35 % (calc) (ref 20–48)
Ferritin: 184 ng/mL (ref 38–380)
Iron: 120 ug/dL (ref 50–180)
TIBC: 343 mcg/dL (calc) (ref 250–425)

## 2019-06-13 NOTE — Assessment & Plan Note (Signed)
DM: Continue Metformin, doing better with diet, increase exercise, check A1c. Dyslipidemia: On Lipitor, check a FLP Increase LFTs: Chart reviewed, has been a modest increase in LFTs on and off since 2017 (where he established with this office), on Metformin since 2017, on atorvastatin since 2019.  Has a glass of wine or Tylenol rarely. Possibly fatty liver?Samuel Rodriguez  We will recheck LFTs today, US abdomen, iron panel. Stress: Listening therapy provided, managing well RTC 6 months CPX

## 2019-08-02 ENCOUNTER — Other Ambulatory Visit: Payer: Self-pay | Admitting: Internal Medicine

## 2019-09-01 ENCOUNTER — Encounter: Payer: Self-pay | Admitting: Internal Medicine

## 2019-09-10 LAB — HM DIABETES EYE EXAM

## 2019-12-24 ENCOUNTER — Encounter: Payer: BC Managed Care – PPO | Admitting: Internal Medicine

## 2020-01-13 ENCOUNTER — Encounter: Payer: Self-pay | Admitting: Internal Medicine

## 2020-01-27 ENCOUNTER — Encounter: Payer: Self-pay | Admitting: Internal Medicine

## 2020-01-27 ENCOUNTER — Ambulatory Visit (INDEPENDENT_AMBULATORY_CARE_PROVIDER_SITE_OTHER): Payer: BC Managed Care – PPO | Admitting: Internal Medicine

## 2020-01-27 ENCOUNTER — Other Ambulatory Visit: Payer: Self-pay

## 2020-01-27 ENCOUNTER — Other Ambulatory Visit: Payer: Self-pay | Admitting: Internal Medicine

## 2020-01-27 VITALS — BP 125/73 | HR 97 | Temp 98.1°F | Ht 71.0 in | Wt 186.0 lb

## 2020-01-27 DIAGNOSIS — Z0000 Encounter for general adult medical examination without abnormal findings: Secondary | ICD-10-CM | POA: Diagnosis not present

## 2020-01-27 DIAGNOSIS — E119 Type 2 diabetes mellitus without complications: Secondary | ICD-10-CM

## 2020-01-27 DIAGNOSIS — H40009 Preglaucoma, unspecified, unspecified eye: Secondary | ICD-10-CM | POA: Insufficient documentation

## 2020-01-27 LAB — COMPREHENSIVE METABOLIC PANEL
ALT: 45 U/L (ref 0–53)
AST: 31 U/L (ref 0–37)
Albumin: 4.5 g/dL (ref 3.5–5.2)
Alkaline Phosphatase: 55 U/L (ref 39–117)
BUN: 18 mg/dL (ref 6–23)
CO2: 31 mEq/L (ref 19–32)
Calcium: 9.7 mg/dL (ref 8.4–10.5)
Chloride: 102 mEq/L (ref 96–112)
Creatinine, Ser: 0.9 mg/dL (ref 0.40–1.50)
GFR: 95.63 mL/min (ref >60.00)
Glucose, Bld: 140 mg/dL — ABNORMAL HIGH (ref 70–99)
Potassium: 4.5 mEq/L (ref 3.5–5.1)
Sodium: 140 mEq/L (ref 135–145)
Total Bilirubin: 0.9 mg/dL (ref 0.2–1.2)
Total Protein: 7 g/dL (ref 6.0–8.3)

## 2020-01-27 LAB — MICROALBUMIN / CREATININE URINE RATIO
Creatinine,U: 193.9 mg/dL
Microalb Creat Ratio: 0.4 mg/g (ref 0.0–30.0)
Microalb, Ur: <0.7 mg/dL (ref 0.0–1.9)

## 2020-01-27 LAB — CBC WITH DIFFERENTIAL/PLATELET
Basophils Absolute: 0 10*3/uL (ref 0.0–0.1)
Basophils Relative: 0.6 % (ref 0.0–3.0)
Eosinophils Absolute: 0.1 10*3/uL (ref 0.0–0.7)
Eosinophils Relative: 3.1 % (ref 0.0–5.0)
HCT: 45 % (ref 39.0–52.0)
Hemoglobin: 15.3 g/dL (ref 13.0–17.0)
Lymphocytes Relative: 40.6 % (ref 12.0–46.0)
Lymphs Abs: 1.9 10*3/uL (ref 0.7–4.0)
MCHC: 33.9 g/dL (ref 30.0–36.0)
MCV: 89.1 fl (ref 78.0–100.0)
Monocytes Absolute: 0.3 10*3/uL (ref 0.1–1.0)
Monocytes Relative: 7 % (ref 3.0–12.0)
Neutro Abs: 2.3 10*3/uL (ref 1.4–7.7)
Neutrophils Relative %: 48.7 % (ref 43.0–77.0)
Platelets: 234 10*3/uL (ref 150.0–400.0)
RBC: 5.05 Mil/uL (ref 4.22–5.81)
RDW: 13.1 % (ref 11.5–15.5)
WBC: 4.7 10*3/uL (ref 4.0–10.5)

## 2020-01-27 LAB — VITAMIN B12: Vitamin B-12: 451 pg/mL (ref 211–911)

## 2020-01-27 LAB — HEMOGLOBIN A1C: Hgb A1c MFr Bld: 6.9 % — ABNORMAL HIGH (ref 4.6–6.5)

## 2020-01-27 NOTE — Assessment & Plan Note (Signed)
-  Td  2017 - pnm 23: 2017 - shingrix:d/w pt before - covid vax x 3  -  had a flu shot  -CCS: was referred to GI last year, did not f/u, plans to proceed in spring, rec to call GI when ready -prostate ca screening: DRE & PSA  Normal: 2020 -Diet, exercise discussed but reports he is doing well. -CMP,CBC, A1c,vit B, micro

## 2020-01-27 NOTE — Telephone Encounter (Signed)
Appt later today.

## 2020-01-27 NOTE — Progress Notes (Signed)
Subjective:    Patient ID: Samuel Rodriguez, male    DOB: 1966-02-12, 54 y.o.   MRN: [id number]  DOS:  01/27/2020 Type of visit - description: cpx  In general feeling well. Occasionally has throat congestion.  It is associated with occasional cough which is very mild.  This is happening for the last few months. Denies fever, postnasal dripping, admits to some sneezing. No wheezing. Very rarely has heartburn.  Wt Readings from Last 3 Encounters:  01/27/20 186 lb (84.4 kg)  06/11/19 187 lb 12.8 oz (85.2 kg)  12/03/18 185 lb 4 oz (84 kg)     Review of Systems  Other than above, a 14 point review of systems is negative      Past Medical History:  Diagnosis Date  . Chronic back pain    Dr. Saintclair Halsted, "chipped disc"  . Diabetes mellitus ([place])   . Glaucoma suspect   . History of chicken pox     Past Surgical History:  Procedure Laterality Date  . NO PAST SURGERIES      Allergies as of 01/27/2020   No Known Allergies     Medication List       Accurate as of January 27, 2020  3:58 PM. If you have any questions, ask your nurse or doctor.        atorvastatin 20 MG tablet Commonly known as: LIPITOR Take 1 tablet (20 mg total) by mouth at bedtime.   glucose blood test strip Check blood sugar no more than twice daily   metFORMIN 500 MG tablet Commonly known as: GLUCOPHAGE Take 1 tablet (500 mg total) by mouth 2 (two) times daily with a meal.   multivitamin tablet Take 1 tablet by mouth daily.   onetouch ultrasoft lancets Check blood sugar no more than twice daily.          Objective:   Physical Exam BP 125/73 (BP Location: Left Arm, Patient Position: Sitting, Cuff Size: Large)   Pulse 97   Temp 98.1 F (36.7 C) (Oral)   Ht 5' 11" (1.803 m)   Wt 186 lb (84.4 kg)   SpO2 97%   BMI 25.94 kg/m  General: Well developed, NAD, BMI noted Neck: No  thyromegaly  HEENT:  Normocephalic . Face symmetric, atraumatic Lungs:  CTA B Normal respiratory effort, no  intercostal retractions, no accessory muscle use. Heart: RRR,  no murmur.  Abdomen:  Not distended, soft, non-tender. No rebound or rigidity.   Lower extremities: no pretibial edema bilaterally  Skin: Exposed areas without rash. Not pale. Not jaundice Neurologic:  alert & oriented X3.  Speech normal, gait appropriate for age and unassisted Strength symmetric and appropriate for age.  Psych: Cognition and judgment appear intact.  Cooperative with normal attention span and concentration.  Behavior appropriate. No anxious or depressed appearing.     Assessment     Assessment: DM A1C 6.3 (04-2015) Dyslipidemia Subacute sensorimotor polyneuropathy, demyelinating-slowing and axon loss?  Likely d/t DM.  W/U 2017:Normal CSF. Normal ESR, SSA/B, copper, SPEP with IFE, vitamin B1, TSH, vitamin B12.  Follow clinically, may need to repeat NCS/EMG if symptoms worsen Benign fasciculations  Neuro 2017: could be related to foraminal stenosis since he has both L4 and L5 nerve impingement previously seen on MRI. Repeat MRI lumbar spine if he develops new weakness or pain Back pain  11-2013, MRI, saw Dr Saintclair Halsted, Left L3-L4 problem, was rx surgery, only did PT, strength returned w/ PT Syncope 2 , 2017: saw cards, "cough  mediated", pt didn't pursue a echo Increase LFTs: Hep B, hep C -11-2018   PLAN Here for CPX DM: On Metformin, check A1c and a B12. Dyslipidemia: On Lipitor, last FLP very good. Nose congestion: C/O nasal congestion, ROS negative except for occasional sneezing, allergies?  Recommend Flonase, call if not better. RTC 6 months     This visit occurred during the SARS-CoV-2 public health emergency.  Safety protocols were in place, including screening questions prior to the visit, additional usage of staff PPE, and extensive cleaning of exam room while observing appropriate contact time as indicated for disinfecting solutions.

## 2020-01-27 NOTE — Patient Instructions (Addendum)
For throat congestion: Flonase 2 sprays on each side of the nose every day.  Call if not better   GO TO THE LAB : Get the blood work     GO TO THE FRONT DESK, PLEASE SCHEDULE YOUR APPOINTMENTS Come back for a checkup in 6 months

## 2020-01-27 NOTE — Assessment & Plan Note (Signed)
Here for CPX DM: On Metformin, check A1c and a B12. Dyslipidemia: On Lipitor, last FLP very good. Nose congestion: C/O nasal congestion, ROS negative except for occasional sneezing, allergies?  Recommend Flonase, call if not better. RTC 6 months

## 2020-01-29 ENCOUNTER — Other Ambulatory Visit: Payer: Self-pay | Admitting: Internal Medicine

## 2020-01-31 MED ORDER — METFORMIN HCL 1000 MG PO TABS: 1000.0000 mg | ORAL_TABLET | Freq: Two times a day (BID) | ORAL | 1 refills | Status: DC

## 2020-01-31 NOTE — Addendum Note (Signed)
Addended byConrad Hoisington D on: 01/31/2020 07:52 AM   Modules accepted: Orders

## 2020-04-10 ENCOUNTER — Telehealth: Payer: Self-pay

## 2020-04-10 ENCOUNTER — Encounter: Payer: Self-pay | Admitting: Internal Medicine

## 2020-04-10 NOTE — Telephone Encounter (Signed)
Pt called back and stated that he is doing a little better at least fever free but still has cough and no taste. I advised about not having apts in this office but could schedule in another office for a virtual call or he could go to a urgent care he decided to wait since he has an apt with [facility] today to get tested for Covid. -JMA

## 2020-04-12 ENCOUNTER — Encounter: Payer: Self-pay | Admitting: Internal Medicine

## 2020-07-29 ENCOUNTER — Other Ambulatory Visit: Payer: Self-pay | Admitting: Internal Medicine

## 2020-08-02 ENCOUNTER — Ambulatory Visit: Payer: BC Managed Care – PPO | Admitting: Internal Medicine

## 2020-09-01 ENCOUNTER — Ambulatory Visit: Payer: BC Managed Care – PPO | Attending: Internal Medicine

## 2020-09-01 ENCOUNTER — Ambulatory Visit: Payer: BC Managed Care – PPO | Admitting: Internal Medicine

## 2020-09-01 ENCOUNTER — Other Ambulatory Visit: Payer: Self-pay

## 2020-09-01 ENCOUNTER — Encounter: Payer: Self-pay | Admitting: Internal Medicine

## 2020-09-01 VITALS — BP 120/80 | HR 80 | Temp 98.2°F | Resp 18 | Ht 71.0 in | Wt 185.4 lb

## 2020-09-01 DIAGNOSIS — G629 Polyneuropathy, unspecified: Secondary | ICD-10-CM | POA: Diagnosis not present

## 2020-09-01 DIAGNOSIS — E785 Hyperlipidemia, unspecified: Secondary | ICD-10-CM | POA: Diagnosis not present

## 2020-09-01 DIAGNOSIS — E119 Type 2 diabetes mellitus without complications: Secondary | ICD-10-CM | POA: Diagnosis not present

## 2020-09-01 DIAGNOSIS — Z7185 Encounter for immunization safety counseling: Secondary | ICD-10-CM

## 2020-09-01 DIAGNOSIS — Z23 Encounter for immunization: Secondary | ICD-10-CM

## 2020-09-01 DIAGNOSIS — Z1211 Encounter for screening for malignant neoplasm of colon: Secondary | ICD-10-CM

## 2020-09-01 LAB — BASIC METABOLIC PANEL
BUN: 19 mg/dL (ref 6–23)
CO2: 30 mEq/L (ref 19–32)
Calcium: 9.7 mg/dL (ref 8.4–10.5)
Chloride: 100 mEq/L (ref 96–112)
Creatinine, Ser: 0.91 mg/dL (ref 0.40–1.50)
GFR: 93.98 mL/min (ref >60.00)
Glucose, Bld: 136 mg/dL — ABNORMAL HIGH (ref 70–99)
Potassium: 4.1 mEq/L (ref 3.5–5.1)
Sodium: 138 mEq/L (ref 135–145)

## 2020-09-01 LAB — LIPID PANEL
Cholesterol: 131 mg/dL (ref 0–200)
HDL: 42.2 mg/dL (ref >39.00)
LDL Cholesterol: 68 mg/dL (ref 0–99)
NonHDL: 89.08
Total CHOL/HDL Ratio: 3
Triglycerides: 106 mg/dL (ref 0.0–149.0)
VLDL: 21.2 mg/dL (ref 0.0–40.0)

## 2020-09-01 LAB — HEMOGLOBIN A1C: Hgb A1c MFr Bld: 7.2 % — ABNORMAL HIGH (ref 4.6–6.5)

## 2020-09-01 NOTE — Progress Notes (Signed)
Subjective:    Patient ID: Samuel Rodriguez, male    DOB: 1966/01/21, 55 y.o.   MRN: [id number]  DOS:  09/01/2020 Type of visit - description: Routine checkup Today with talk about diabetes, high cholesterol, vaccinations, colonoscopies and neuropathy. In general feels well. Normal ambulatory BPs. Neuropathy symptoms unchanged   Review of Systems No new concerns  Past Medical History:  Diagnosis Date   Chronic back pain    Dr. Wynetta Emery, "chipped disc"   Diabetes mellitus ([facility])    Glaucoma suspect    History of chicken pox     Past Surgical History:  Procedure Laterality Date   NO PAST SURGERIES      Allergies as of 09/01/2020   No Known Allergies      Medication List        Accurate as of September 01, 2020 11:59 PM. If you have any questions, ask your nurse or doctor.          atorvastatin 20 MG tablet Commonly known as: LIPITOR Take 1 tablet (20 mg total) by mouth at bedtime.   glucose blood test strip Check blood sugar no more than twice daily   metFORMIN 1000 MG tablet Commonly known as: GLUCOPHAGE Take 1 tablet (1,000 mg total) by mouth 2 (two) times daily with a meal.   multivitamin tablet Take 1 tablet by mouth daily.   onetouch ultrasoft lancets Check blood sugar no more than twice daily.           Objective:   Physical Exam BP 120/80   Pulse 80   Temp 98.2 F (36.8 C) (Oral)   Resp 18   Ht 5\' 11"  (1.803 m)   Wt 185 lb 6 oz (84.1 kg)   SpO2 98%   BMI 25.85 kg/m  General:   Well developed, NAD, BMI noted. HEENT:  Normocephalic . Face symmetric, atraumatic Lungs:  CTA B Normal respiratory effort, no intercostal retractions, no accessory muscle use. Heart: RRR,  no murmur.  DM foot exam: No edema, good pedal pulses, pinprick examination: Decreased sensitivity throughout feet Skin: Not pale. Not jaundice Neurologic:  alert & oriented X3.  Speech normal, gait appropriate for age and unassisted Psych--  Cognition and judgment  appear intact.  Cooperative with normal attention span and concentration.  Behavior appropriate. No anxious or depressed appearing.      Assessment     Assessment: DM A1C 6.3 (04-2015) Dyslipidemia Subacute sensorimotor polyneuropathy, demyelinating-slowing and axon loss?  Likely d/t DM.  W/U 2017: Normal CSF. Normal ESR, SSA/B, copper, SPEP with IFE, vitamin B1, TSH, vitamin B12.  Follow clinically, may need to repeat NCS/EMG if symptoms worsen Benign fasciculations  Neuro 2017: could be related to foraminal stenosis since he has both L4 and L5 nerve impingement previously seen on MRI. Repeat MRI lumbar spine if he develops new weakness or pain Back pain  11-2013, MRI, saw Dr 2018, Left L3-L4 problem, was rx surgery, only did PT, strength returned w/ PT Syncope 2 , 2017: saw cards, "cough mediated", pt didn't pursue a echo Increase LFTs: Hep B, hep C -11-2018   PLAN DM: Last A1c 6.9, metformin dose increased, good compliance and tolerance. Ambulatory CBGs are checked sporadically, nonfasting around 120. Plan: Check A1c, BMP. Neuropathy: Feet exam consistent with a diagnosis, feet care discussed. High cholesterol: On Lipitor, check a FLP.  Last LFTs normal Preventive care:  Recommend to contact GI for colonoscopy Recommend COVID-vaccine #4 RTC CPX 5 months     This  visit occurred during the SARS-CoV-2 public health emergency.  Safety protocols were in place, including screening questions prior to the visit, additional usage of staff PPE, and extensive cleaning of exam room while observing appropriate contact time as indicated for disinfecting solutions.

## 2020-09-01 NOTE — Patient Instructions (Addendum)
We have placed a new referral for your colonoscopy with Dr. Adela Lank- please call his office at [phone] to schedule an appointment.    Check the  blood pressure 2 or 3 times a month BP GOAL is between 110/65 and  135/85. If it is consistently higher or lower, let me know   GO TO THE LAB : Get the blood work     GO TO THE FRONT DESK, PLEASE SCHEDULE YOUR APPOINTMENTS Come back for  for a physical exam by 01-2021     Diabetes Mellitus and Foot Care Foot care is an important part of your health, especially when you have diabetes. Diabetes may cause you to have problems because of poor blood flow (circulation) to your feet and legs, which can cause your skin to: Become thinner and drier. Break more easily. Heal more slowly. Peel and crack. You may also have nerve damage (neuropathy) in your legs and feet, causing decreased feeling in them. This means that you may not notice minor injuries to your feet that could lead to more serious problems. Noticing and addressing any potential problems early is the best wayto prevent future foot problems. How to care for your feet Foot hygiene  Wash your feet daily with warm water and mild soap. Do not use hot water. Then, pat your feet and the areas between your toes until they are completely dry. Do not soak your feet as this can dry your skin. Trim your toenails straight across. Do not dig under them or around the cuticle. File the edges of your nails with an emery board or nail file. Apply a moisturizing lotion or petroleum jelly to the skin on your feet and to dry, brittle toenails. Use lotion that does not contain alcohol and is unscented. Do not apply lotion between your toes.  Shoes and socks Wear clean socks or stockings every day. Make sure they are not too tight. Do not wear knee-high stockings since they may decrease blood flow to your legs. Wear shoes that fit properly and have enough cushioning. Always look in your shoes before you  put them on to be sure there are no objects inside. To break in new shoes, wear them for just a few hours a day. This prevents injuries on your feet. Wounds, scrapes, corns, and calluses  Check your feet daily for blisters, cuts, bruises, sores, and redness. If you cannot see the bottom of your feet, use a mirror or ask someone for help. Do not cut corns or calluses or try to remove them with medicine. If you find a minor scrape, cut, or break in the skin on your feet, keep it and the skin around it clean and dry. You may clean these areas with mild soap and water. Do not clean the area with peroxide, alcohol, or iodine. If you have a wound, scrape, corn, or callus on your foot, look at it several times a day to make sure it is healing and not infected. Check for: Redness, swelling, or pain. Fluid or blood. Warmth. Pus or a bad smell.  General tips Do not cross your legs. This may decrease blood flow to your feet. Do not use heating pads or hot water bottles on your feet. They may burn your skin. If you have lost feeling in your feet or legs, you may not know this is happening until it is too late. Protect your feet from hot and cold by wearing shoes, such as at the beach or on hot pavement.  Schedule a complete foot exam at least once a year (annually) or more often if you have foot problems. Report any cuts, sores, or bruises to your health care provider immediately. Where to find more information American Diabetes Association: www.diabetes.org Association of Diabetes Care & Education Specialists: www.diabeteseducator.org Contact a health care provider if: You have a medical condition that increases your risk of infection and you have any cuts, sores, or bruises on your feet. You have an injury that is not healing. You have redness on your legs or feet. You feel burning or tingling in your legs or feet. You have pain or cramps in your legs and feet. Your legs or feet are numb. Your feet  always feel cold. You have pain around any toenails. Get help right away if: You have a wound, scrape, corn, or callus on your foot and: You have pain, swelling, or redness that gets worse. You have fluid or blood coming from the wound, scrape, corn, or callus. Your wound, scrape, corn, or callus feels warm to the touch. You have pus or a bad smell coming from the wound, scrape, corn, or callus. You have a fever. You have a red line going up your leg. Summary Check your feet every day for blisters, cuts, bruises, sores, and redness. Apply a moisturizing lotion or petroleum jelly to the skin on your feet and to dry, brittle toenails. Wear shoes that fit properly and have enough cushioning. If you have foot problems, report any cuts, sores, or bruises to your health care provider immediately. Schedule a complete foot exam at least once a year (annually) or more often if you have foot problems. This information is not intended to replace advice given to you by your health care provider. Make sure you discuss any questions you have with your healthcare provider. Document Revised: 08/26/2019 Document Reviewed: 08/26/2019 Elsevier Patient Education  2022 [employer].

## 2020-09-01 NOTE — Progress Notes (Signed)
   Covid-19 Vaccination Clinic  Name:  Samuel Rodriguez    MRN: [id number] DOB: March 06, 1965  09/01/2020  Mr. Samuel Rodriguez was observed post Covid-19 immunization for 15 minutes without incident. He was provided with Vaccine Information Sheet and instruction to access the V-Safe system.   Mr. Samuel Rodriguez was instructed to call 911 with any severe reactions post vaccine: Difficulty breathing  Swelling of face and throat  A fast heartbeat  A bad rash all over body  Dizziness and weakness   Immunizations Administered     Name Date Dose VIS Date Route   PFIZER Comrnaty(Gray TOP) Covid-19 Vaccine 09/01/2020 10:45 AM 0.3 mL 01/27/2020 Intramuscular   Manufacturer: [employer], [employer]   Lot: [id number]   NDC: [phone]

## 2020-09-02 NOTE — Assessment & Plan Note (Signed)
DM: Last A1c 6.9, metformin dose increased, good compliance and tolerance. Ambulatory CBGs are checked sporadically, nonfasting around 120. Plan: Check A1c, BMP. Neuropathy: Feet exam consistent with a diagnosis, feet care discussed. High cholesterol: On Lipitor, check a FLP.  Last LFTs normal Preventive care:  Recommend to contact GI for colonoscopy Recommend COVID-vaccine #4 RTC CPX 5 months

## 2020-09-04 ENCOUNTER — Other Ambulatory Visit: Payer: Self-pay | Admitting: *Deleted

## 2020-09-04 DIAGNOSIS — E119 Type 2 diabetes mellitus without complications: Secondary | ICD-10-CM

## 2020-09-04 DIAGNOSIS — E785 Hyperlipidemia, unspecified: Secondary | ICD-10-CM

## 2020-09-04 MED ORDER — EMPAGLIFLOZIN 10 MG PO TABS: 10.0000 mg | ORAL_TABLET | Freq: Every day | ORAL | 2 refills | Status: DC

## 2020-09-05 ENCOUNTER — Encounter: Payer: Self-pay | Admitting: Internal Medicine

## 2020-09-05 ENCOUNTER — Other Ambulatory Visit (HOSPITAL_BASED_OUTPATIENT_CLINIC_OR_DEPARTMENT_OTHER): Payer: Self-pay

## 2020-09-05 MED ORDER — COVID-19 MRNA VAC-TRIS(PFIZER) 30 MCG/0.3ML IM SUSP
INTRAMUSCULAR | 0 refills | Status: DC
  Filled 2020-09-05: qty 0.3, 1d supply, fill #0

## 2020-09-26 ENCOUNTER — Encounter: Payer: Self-pay | Admitting: Internal Medicine

## 2020-09-26 LAB — HM DIABETES EYE EXAM

## 2020-10-31 ENCOUNTER — Ambulatory Visit: Payer: BC Managed Care – PPO | Admitting: Internal Medicine

## 2020-11-01 ENCOUNTER — Ambulatory Visit: Payer: BC Managed Care – PPO | Admitting: Internal Medicine

## 2020-12-02 ENCOUNTER — Other Ambulatory Visit: Payer: Self-pay | Admitting: Internal Medicine

## 2021-01-27 ENCOUNTER — Encounter: Payer: Self-pay | Admitting: Internal Medicine

## 2021-01-31 ENCOUNTER — Other Ambulatory Visit: Payer: Self-pay | Admitting: Internal Medicine

## 2021-02-14 ENCOUNTER — Encounter: Payer: BC Managed Care – PPO | Admitting: Internal Medicine

## 2021-03-05 ENCOUNTER — Encounter: Payer: Self-pay | Admitting: Internal Medicine

## 2021-03-05 MED ORDER — EMPAGLIFLOZIN 10 MG PO TABS: 10.0000 mg | ORAL_TABLET | Freq: Every day | ORAL | 0 refills | Status: DC

## 2021-04-20 ENCOUNTER — Encounter: Payer: Self-pay | Admitting: Internal Medicine

## 2021-04-30 ENCOUNTER — Other Ambulatory Visit: Payer: Self-pay | Admitting: Internal Medicine

## 2021-06-20 ENCOUNTER — Encounter: Payer: BC Managed Care – PPO | Admitting: Internal Medicine

## 2021-06-25 ENCOUNTER — Encounter: Payer: Self-pay | Admitting: Internal Medicine

## 2021-06-25 MED ORDER — EMPAGLIFLOZIN 10 MG PO TABS: 10.0000 mg | ORAL_TABLET | Freq: Every day | ORAL | 0 refills | Status: DC

## 2021-06-25 MED ORDER — METFORMIN HCL 1000 MG PO TABS: 1000.0000 mg | ORAL_TABLET | Freq: Two times a day (BID) | ORAL | 0 refills | Status: DC

## 2021-07-02 ENCOUNTER — Encounter: Payer: Self-pay | Admitting: Internal Medicine

## 2021-07-02 ENCOUNTER — Ambulatory Visit (INDEPENDENT_AMBULATORY_CARE_PROVIDER_SITE_OTHER): Payer: BC Managed Care – PPO | Admitting: Internal Medicine

## 2021-07-02 VITALS — BP 118/82 | HR 83 | Temp 98.4°F | Resp 16 | Ht 71.0 in | Wt 177.1 lb

## 2021-07-02 DIAGNOSIS — E785 Hyperlipidemia, unspecified: Secondary | ICD-10-CM | POA: Diagnosis not present

## 2021-07-02 DIAGNOSIS — Z1211 Encounter for screening for malignant neoplasm of colon: Secondary | ICD-10-CM

## 2021-07-02 DIAGNOSIS — E119 Type 2 diabetes mellitus without complications: Secondary | ICD-10-CM | POA: Diagnosis not present

## 2021-07-02 DIAGNOSIS — Z0000 Encounter for general adult medical examination without abnormal findings: Secondary | ICD-10-CM

## 2021-07-02 LAB — CBC WITH DIFFERENTIAL/PLATELET
Basophils Absolute: 0 10*3/uL (ref 0.0–0.1)
Basophils Relative: 0.3 % (ref 0.0–3.0)
Eosinophils Absolute: 0.1 10*3/uL (ref 0.0–0.7)
Eosinophils Relative: 1.7 % (ref 0.0–5.0)
HCT: 46.3 % (ref 39.0–52.0)
Hemoglobin: 15.7 g/dL (ref 13.0–17.0)
Lymphocytes Relative: 39.1 % (ref 12.0–46.0)
Lymphs Abs: 2.1 10*3/uL (ref 0.7–4.0)
MCHC: 33.9 g/dL (ref 30.0–36.0)
MCV: 89.3 fl (ref 78.0–100.0)
Monocytes Absolute: 0.4 10*3/uL (ref 0.1–1.0)
Monocytes Relative: 6.9 % (ref 3.0–12.0)
Neutro Abs: 2.8 10*3/uL (ref 1.4–7.7)
Neutrophils Relative %: 52 % (ref 43.0–77.0)
Platelets: 225 10*3/uL (ref 150.0–400.0)
RBC: 5.18 Mil/uL (ref 4.22–5.81)
RDW: 13.3 % (ref 11.5–15.5)
WBC: 5.3 10*3/uL (ref 4.0–10.5)

## 2021-07-02 LAB — HEMOGLOBIN A1C: Hgb A1c MFr Bld: 6.5 % (ref 4.6–6.5)

## 2021-07-02 LAB — PSA: PSA: 0.52 ng/mL (ref 0.10–4.00)

## 2021-07-02 NOTE — Progress Notes (Signed)
? ?Subjective:  ? ? Patient ID: Samuel Rodriguez, male    DOB: July 27, 1965, 56 y.o.   MRN: [id number] ? ?DOS:  07/02/2021 ?Type of visit - description: CPX ? ?In general feeling well. ?+ wt loss noted since he started Jardiance. ?Occasionally has a pain at the groin area, left or right, nothing persistent. ?Otherwise no new symptoms ? ?Review of Systems ? ?Other than above, a 14 point review of systems is negative  ? ?  ? ? ?Past Medical History:  ?Diagnosis Date  ? Chronic back pain   ? Dr. Saintclair Halsted, "chipped disc"  ? Diabetes mellitus ([place])   ? Glaucoma suspect   ? History of chicken pox   ? ? ?Past Surgical History:  ?Procedure Laterality Date  ? NO PAST SURGERIES    ? ?Social History  ? ?Socioeconomic History  ? Marital status: Single  ?  Spouse name: Not on file  ? Number of children: 0  ? Years of education: Not on file  ? Highest education level: Not on file  ?Occupational History  ? Occupation: Education officer, museum, [employer]  ? Occupation: masters   ?Tobacco Use  ? Smoking status: Never  ? Smokeless tobacco: Never  ?Substance and Sexual Activity  ? Alcohol use: Yes  ?  Alcohol/week: 0.0 standard drinks  ?  Comment: Glass of wine 4-5 times per week.     ? Drug use: No  ? Sexual activity: Not on file  ?Other Topics Concern  ? Not on file  ?Social History Narrative  ? Lives by himself in a condominium.  No children.    ? Works , Hydrographic surveyor     ? Education: Masters  ? From  [place]   ? ?Social Determinants of Health  ? ?Financial Resource Strain: Not on file  ?Food Insecurity: Not on file  ?Transportation Needs: Not on file  ?Physical Activity: Not on file  ?Stress: Not on file  ?Social Connections: Not on file  ?Intimate Partner Violence: Not on file  ? ? ?Current Outpatient Medications  ?Medication Instructions  ? atorvastatin (LIPITOR) 20 MG tablet TAKE 1 TABLET BY MOUTH EVERYDAY AT BEDTIME  ? empagliflozin (JARDIANCE) 10 mg, Oral, Daily before breakfast  ? glucose blood test strip Check blood sugar no more than  twice daily  ? Lancets (ONETOUCH ULTRASOFT) lancets Check blood sugar no more than twice daily.  ? metFORMIN (GLUCOPHAGE) 1,000 mg, Oral, 2 times daily with meals  ? Multiple Vitamin (MULTIVITAMIN) tablet 1 tablet, Oral, Daily  ? ? ?   ?Objective:  ? Physical Exam ?BP 118/82 (BP Location: Left Arm, Patient Position: Sitting, Cuff Size: Small)   Pulse 83   Temp 98.4 ?F (36.9 ?C) (Oral)   Resp 16   Ht '5\' 11"'  (1.803 m)   Wt 177 lb 2 oz (80.3 kg)   SpO2 98%   BMI 24.70 kg/m?  ?General: ?Well developed, NAD, BMI noted ?Neck: No  thyromegaly  ?HEENT:  ?Normocephalic . Face symmetric, atraumatic ?Lungs:  ?CTA B ?Normal respiratory effort, no intercostal retractions, no accessory muscle use. ?Heart: RRR,  no murmur.  ?Abdomen:  ?Not distended, soft, non-tender. No rebound or rigidity.   ?Lower extremities: no pretibial edema bilaterally ?DRE: Normal sphincter tone.  Brown stools.  Prostate normal ?Skin: Exposed areas without rash. Not pale. Not jaundice ?Neurologic:  ?alert & oriented X3.  ?Speech normal, gait appropriate for age and unassisted ?Strength symmetric and appropriate for age.  ?Psych: ?Cognition and judgment appear intact.  ?  Cooperative with normal attention span and concentration.  ?Behavior appropriate. ?No anxious or depressed appearing. ? ?   ?Assessment   ? ? Assessment: ?DM A1C 6.3 (04-2015) ?Dyslipidemia ?Subacute sensorimotor polyneuropathy, demyelinating-slowing and axon loss?  ?Likely d/t DM.  W/U 2017: Normal CSF. Normal ESR, SSA/B, copper, SPEP with IFE, vitamin B1, TSH, vitamin B12.  ?Follow clinically, may need to repeat NCS/EMG if symptoms worsen ?Benign fasciculations  ?Neuro 2017: could be related to foraminal stenosis since he has both L4 and L5 nerve impingement previously seen on MRI. Repeat MRI lumbar spine if he develops new weakness or pain ?Back pain  ?11-2013, MRI, saw Dr Saintclair Halsted, Left L3-L4 problem, was rx surgery, only did PT, strength returned w/ PT ?Syncope ?2 , 2017: saw cards,  "cough mediated", pt didn't pursue a echo ?Increase LFTs: Hep B, hep C -11-2018 ? ? ?PLAN ?Here for CPX ?DM: Currently on metformin and Jardiance, + weight loss noted, patient reports this started around the time he started [place].  He is more physically active, diet about the same.  Check labs. ?High chol: On atorvastatin, checking labs. ?RTC 5 to 6 months depending on results ? ? ?

## 2021-07-02 NOTE — Patient Instructions (Addendum)
Recommend to proceed with covid booster (bivalent) at your pharmacy.  ?  ? ?We are referring you for a Cologuard, check the  coverage with your insurance before sending the sample ? ?GO TO THE LAB : Get the blood work   ? ? ?GO TO THE FRONT DESK, PLEASE SCHEDULE YOUR APPOINTMENTS ?Come back for checkup in 5 to 6 months ? ? ? ? ? ?"Living will", "Health Care Power of attorney": Advanced care planning ? ?(If you already have a living will or healthcare power of attorney, please bring the copy to be scanned in your chart.) ? ?Advance care planning is a process that supports adults in  understanding and sharing their preferences regarding future medical care.  ? ?The patient's preferences are recorded in documents called Advance Directives.    ?Advanced directives are completed (and can be modified at any time) while the patient is in full mental capacity.  ? ?The documentation should be available at all times to the patient, the family and the healthcare providers.  ?Bring in a copy to be scanned in your chart is an excellent idea and is recommended  ? ?This legal documents direct treatment decision making and/or appoint a surrogate to make the decision if the patient is not capable to do so.  ? ? ?Advance directives can be documented in many types of formats,  documents have names such as:  ?Lliving will  ?Durable power of attorney for healthcare (healthcare proxy or healthcare power of attorney)  ?Combined directives  ?Physician orders for life-sustaining treatment  ?  ?More information at: ? ?[email or web address]  ?

## 2021-07-03 ENCOUNTER — Encounter: Payer: Self-pay | Admitting: Internal Medicine

## 2021-07-03 LAB — MICROALBUMIN / CREATININE URINE RATIO
Creatinine,U: 67.4 mg/dL
Microalb Creat Ratio: 1 mg/g (ref 0.0–30.0)
Microalb, Ur: <0.7 mg/dL (ref 0.0–1.9)

## 2021-07-03 LAB — LIPID PANEL
Cholesterol: 137 mg/dL (ref 0–200)
HDL: 47.9 mg/dL (ref >39.00)
LDL Cholesterol: 69 mg/dL (ref 0–99)
NonHDL: 89.13
Total CHOL/HDL Ratio: 3
Triglycerides: 103 mg/dL (ref 0.0–149.0)
VLDL: 20.6 mg/dL (ref 0.0–40.0)

## 2021-07-03 LAB — COMPREHENSIVE METABOLIC PANEL
ALT: 32 U/L (ref 0–53)
AST: 22 U/L (ref 0–37)
Albumin: 4.6 g/dL (ref 3.5–5.2)
Alkaline Phosphatase: 64 U/L (ref 39–117)
BUN: 21 mg/dL (ref 6–23)
CO2: 23 mEq/L (ref 19–32)
Calcium: 9.3 mg/dL (ref 8.4–10.5)
Chloride: 103 mEq/L (ref 96–112)
Creatinine, Ser: 0.88 mg/dL (ref 0.40–1.50)
GFR: 95.32 mL/min (ref >60.00)
Glucose, Bld: 128 mg/dL — ABNORMAL HIGH (ref 70–99)
Potassium: 4.3 mEq/L (ref 3.5–5.1)
Sodium: 138 mEq/L (ref 135–145)
Total Bilirubin: 0.8 mg/dL (ref 0.2–1.2)
Total Protein: 7.2 g/dL (ref 6.0–8.3)

## 2021-07-03 NOTE — Assessment & Plan Note (Signed)
Here for CPX ?DM: Currently on metformin and Jardiance, + weight loss noted, patient reports this started around the time he started [place].  He is more physically active, diet about the same.  Check labs. ?High chol: On atorvastatin, checking labs. ?RTC 5 to 6 months depending on results ?

## 2021-07-03 NOTE — Assessment & Plan Note (Signed)
-  Td  2017 ?- pnm 23: 2017 ?- shingrix: will wait for now  ?- covid vax x 4 : booster is an option  ?-[facility]: Scheduling a colonoscopy has been a challenging because the patient's schedule,  3 options discussed, elected Cologuard. ?-prostate ca screening: DRE normal, no symptoms, check a PSA ?-Diet, exercise discussed  ?- CPX:CMP, FLP, CBC, A1c, micro, PSA ?-ACP discussed ?

## 2021-07-24 ENCOUNTER — Other Ambulatory Visit: Payer: Self-pay | Admitting: Internal Medicine

## 2021-07-24 MED ORDER — EMPAGLIFLOZIN 10 MG PO TABS: 10.0000 mg | ORAL_TABLET | Freq: Every day | ORAL | 1 refills | Status: DC

## 2021-09-17 ENCOUNTER — Encounter: Payer: Self-pay | Admitting: Internal Medicine

## 2021-09-28 LAB — HM DIABETES EYE EXAM

## 2021-10-01 ENCOUNTER — Encounter: Payer: Self-pay | Admitting: Internal Medicine

## 2022-01-04 ENCOUNTER — Ambulatory Visit: Payer: BC Managed Care – PPO | Admitting: Internal Medicine

## 2022-01-23 ENCOUNTER — Other Ambulatory Visit: Payer: Self-pay | Admitting: Internal Medicine

## 2022-02-14 ENCOUNTER — Encounter: Payer: Self-pay | Admitting: Internal Medicine

## 2022-02-15 ENCOUNTER — Ambulatory Visit: Payer: BC Managed Care – PPO | Admitting: Internal Medicine

## 2022-03-21 ENCOUNTER — Encounter: Payer: Self-pay | Admitting: Internal Medicine

## 2022-04-05 ENCOUNTER — Encounter: Payer: Self-pay | Admitting: Internal Medicine

## 2022-05-30 NOTE — Telephone Encounter (Signed)
Pt never responded to February mychart message about cologuard and still hasn't returned kit.  Mailed letter.

## 2022-07-23 ENCOUNTER — Other Ambulatory Visit: Payer: Self-pay | Admitting: Internal Medicine

## 2022-09-20 ENCOUNTER — Other Ambulatory Visit: Payer: Self-pay | Admitting: Internal Medicine

## 2022-09-20 ENCOUNTER — Ambulatory Visit: Payer: BC Managed Care – PPO | Admitting: Internal Medicine

## 2022-09-20 ENCOUNTER — Encounter: Payer: Self-pay | Admitting: Internal Medicine

## 2022-09-20 ENCOUNTER — Telehealth: Payer: Self-pay

## 2022-09-20 VITALS — BP 125/78 | HR 76 | Temp 98.0°F | Resp 16 | Ht 71.0 in | Wt 182.4 lb

## 2022-09-20 DIAGNOSIS — E119 Type 2 diabetes mellitus without complications: Secondary | ICD-10-CM | POA: Diagnosis not present

## 2022-09-20 DIAGNOSIS — Z125 Encounter for screening for malignant neoplasm of prostate: Secondary | ICD-10-CM | POA: Diagnosis not present

## 2022-09-20 DIAGNOSIS — E785 Hyperlipidemia, unspecified: Secondary | ICD-10-CM

## 2022-09-20 DIAGNOSIS — Z0001 Encounter for general adult medical examination with abnormal findings: Secondary | ICD-10-CM

## 2022-09-20 DIAGNOSIS — J4520 Mild intermittent asthma, uncomplicated: Secondary | ICD-10-CM | POA: Diagnosis not present

## 2022-09-20 DIAGNOSIS — Z7984 Long term (current) use of oral hypoglycemic drugs: Secondary | ICD-10-CM

## 2022-09-20 DIAGNOSIS — Z0000 Encounter for general adult medical examination without abnormal findings: Secondary | ICD-10-CM

## 2022-09-20 LAB — LIPID PANEL
Cholesterol: 201 mg/dL — ABNORMAL HIGH (ref 0–200)
HDL: 43 mg/dL (ref >39.00)
LDL Cholesterol: 128 mg/dL — ABNORMAL HIGH (ref 0–99)
NonHDL: 157.95
Total CHOL/HDL Ratio: 5
Triglycerides: 152 mg/dL — ABNORMAL HIGH (ref 0.0–149.0)
VLDL: 30.4 mg/dL (ref 0.0–40.0)

## 2022-09-20 LAB — CBC WITH DIFFERENTIAL/PLATELET
Basophils Absolute: 0 10*3/uL (ref 0.0–0.1)
Basophils Relative: 0.5 % (ref 0.0–3.0)
Eosinophils Absolute: 0.1 10*3/uL (ref 0.0–0.7)
Eosinophils Relative: 1.4 % (ref 0.0–5.0)
HCT: 46.5 % (ref 39.0–52.0)
Hemoglobin: 15.4 g/dL (ref 13.0–17.0)
Lymphocytes Relative: 47.2 % — ABNORMAL HIGH (ref 12.0–46.0)
Lymphs Abs: 2.2 10*3/uL (ref 0.7–4.0)
MCHC: 33 g/dL (ref 30.0–36.0)
MCV: 89.6 fl (ref 78.0–100.0)
Monocytes Absolute: 0.4 10*3/uL (ref 0.1–1.0)
Monocytes Relative: 7.7 % (ref 3.0–12.0)
Neutro Abs: 2 10*3/uL (ref 1.4–7.7)
Neutrophils Relative %: 43.2 % (ref 43.0–77.0)
Platelets: 260 10*3/uL (ref 150.0–400.0)
RBC: 5.2 Mil/uL (ref 4.22–5.81)
RDW: 13.1 % (ref 11.5–15.5)
WBC: 4.6 10*3/uL (ref 4.0–10.5)

## 2022-09-20 LAB — COMPREHENSIVE METABOLIC PANEL
ALT: 45 U/L (ref 0–53)
AST: 29 U/L (ref 0–37)
Albumin: 4.5 g/dL (ref 3.5–5.2)
Alkaline Phosphatase: 72 U/L (ref 39–117)
BUN: 20 mg/dL (ref 6–23)
CO2: 29 mEq/L (ref 19–32)
Calcium: 9.7 mg/dL (ref 8.4–10.5)
Chloride: 102 mEq/L (ref 96–112)
Creatinine, Ser: 0.85 mg/dL (ref 0.40–1.50)
GFR: 95.5 mL/min (ref >60.00)
Glucose, Bld: 127 mg/dL — ABNORMAL HIGH (ref 70–99)
Potassium: 4.4 mEq/L (ref 3.5–5.1)
Sodium: 138 mEq/L (ref 135–145)
Total Bilirubin: 0.7 mg/dL (ref 0.2–1.2)
Total Protein: 7 g/dL (ref 6.0–8.3)

## 2022-09-20 LAB — MICROALBUMIN / CREATININE URINE RATIO
Creatinine,U: 62.1 mg/dL
Microalb Creat Ratio: 1.1 mg/g (ref 0.0–30.0)
Microalb, Ur: <0.7 mg/dL (ref 0.0–1.9)

## 2022-09-20 LAB — TSH: TSH: 2.6 u[IU]/mL (ref 0.35–5.50)

## 2022-09-20 LAB — PSA: PSA: 0.5 ng/mL (ref 0.10–4.00)

## 2022-09-20 LAB — HEMOGLOBIN A1C: Hgb A1c MFr Bld: 7.2 % — ABNORMAL HIGH (ref 4.6–6.5)

## 2022-09-20 MED ORDER — DEXCOM G7 RECEIVER DEVI: 0 refills | Status: DC

## 2022-09-20 MED ORDER — FREESTYLE LIBRE 3 SENSOR MISC: 3 refills | Status: DC

## 2022-09-20 MED ORDER — DEXCOM G7 SENSOR MISC: 3 refills | Status: DC

## 2022-09-20 MED ORDER — GLUCOSE BLOOD VI STRP: ORAL_STRIP | 12 refills | Status: AC

## 2022-09-20 MED ORDER — ALBUTEROL SULFATE HFA 108 (90 BASE) MCG/ACT IN AERS: 2.0000 | INHALATION_SPRAY | Freq: Four times a day (QID) | RESPIRATORY_TRACT | 2 refills | Status: AC | PRN

## 2022-09-20 MED ORDER — FREESTYLE LIBRE 3 READER DEVI: 0 refills | Status: DC

## 2022-09-20 MED ORDER — ONETOUCH ULTRASOFT LANCETS MISC: 12 refills | Status: AC

## 2022-09-20 NOTE — Telephone Encounter (Signed)
Notify the patient via message about the denial from his insurance. Will have to continue checking his blood sugars with a glucometer

## 2022-09-20 NOTE — Progress Notes (Unsigned)
Subjective:    Patient ID: Samuel Rodriguez, male    DOB: 09-16-1965, 57 y.o.   MRN: [id number]  DOS:  09/20/2022 Type of visit - description: CPX  Here for CPX. Chronic medical problems. Chronic medical problems addressed.  Review of Systems See above   Past Medical History:  Diagnosis Date   Chronic back pain    Dr. Wynetta Emery, "chipped disc"   Diabetes mellitus (HCC)    Glaucoma suspect    History of chicken pox     Past Surgical History:  Procedure Laterality Date   NO PAST SURGERIES      Current Outpatient Medications  Medication Instructions   atorvastatin (LIPITOR) 20 MG tablet TAKE 1 TABLET BY MOUTH EVERYDAY AT BEDTIME   empagliflozin (JARDIANCE) 10 mg, Oral, Daily before breakfast   glucose blood test strip Check blood sugar no more than twice daily   Lancets (ONETOUCH ULTRASOFT) lancets Check blood sugar no more than twice daily.   metFORMIN (GLUCOPHAGE) 1,000 mg, Oral, 2 times daily with meals   Multiple Vitamin (MULTIVITAMIN) tablet 1 tablet, Oral, Daily       Objective:   Physical Exam BP 125/78   Pulse 76   Temp 98 F (36.7 C) (Oral)   Resp 16   Ht 5\' 11"  (1.803 m)   Wt 182 lb 6 oz (82.7 kg)   SpO2 95%   BMI 25.44 kg/m  General: Well developed, NAD, BMI noted Neck: No  thyromegaly  HEENT:  Normocephalic . Face symmetric, atraumatic Lungs:  CTA B Normal respiratory effort, no intercostal retractions, no accessory muscle use. Heart: RRR,  no murmur.  Abdomen:  Not distended, soft, non-tender. No rebound or rigidity.   Lower extremities: no pretibial edema bilaterally  Skin: Exposed areas without rash. Not pale. Not jaundice Neurologic:  alert & oriented X3.  Speech normal, gait appropriate for age and unassisted Strength symmetric and appropriate for age.  Psych: Cognition and judgment appear intact.  Cooperative with normal attention span and concentration.  Behavior appropriate. No anxious or depressed appearing.     Assessment      Assessment: DM A1C 6.3 (04-2015) Dyslipidemia Subacute sensorimotor polyneuropathy, demyelinating-slowing and axon loss?  Likely d/t DM.  W/U 2017: Normal CSF. Normal ESR, SSA/B, copper, SPEP with IFE, vitamin B1, TSH, vitamin B12.  Follow clinically, may need to repeat NCS/EMG if symptoms worsen Benign fasciculations  Neuro 2017: could be related to foraminal stenosis since he has both L4 and L5 nerve impingement previously seen on MRI. Repeat MRI lumbar spine if he develops new weakness or pain Back pain  11-2013, MRI, saw Dr Wynetta Emery, Left L3-L4 problem, was rx surgery, only did PT, strength returned w/ PT Syncope 2 , 2017: saw cards, "cough mediated", pt didn't pursue a echo Increase LFTs: Hep B, hep C -11-2018   PLAN Here for CPX Stool test CMP FLP CBC A1c TSH PSA Freestyle libre -Td  2017 - pnm 23: 2017 Vaccines I recommend: Shingrix, COVID booster, flu shot every fall -CCS: 3 options discussed, scheduling a colonoscopy has been a challenge, has not returned Cologuards we agree on IFOB  -prostate ca screening: No symptoms, check PSA -Diet, exercise discussed  -Labs: CMP, FLP, CBC, A1c, micro, PSA - Care POA: Information provided  DM: On Jardiance, metformin.  Ambulatory CBGs usually 160, 170 (nonfasting).  Patient is interested in a CGM, Rx sent.  Checking labs. High cholesterol: Ran out of atorvastatin months ago, would like to see how his cholesterol is.  We will check a FLP but advised patient that with results I will restart statins because he has diabetes Bronchospasm: Reports wheezing with URIs or severe hot weather, most likely has reactive airway disease, the school nurse gave him albuterol to be used as needed, use it infrequently.  Rx sent. RTC 3 months   === DM: Currently on metformin and Jardiance, + weight loss noted, patient reports this started around the time he started [place].  He is more physically active, diet about the same.  Check labs. High chol: On  atorvastatin, checking labs. RTC 5 to 6 months depending on results

## 2022-09-20 NOTE — Telephone Encounter (Signed)
PA denied.   Your plan only covers this product if you will be using it with an intensive insulin regimen. We have denied your request because: A) You are not (or will not be) taking it, and B) You do not have a medical reason not to take it. We reviewed the information we had. Your request has been denied. Your doctor can send [place] any new or missing information for [place] to review. For this product, you may have to meet other criteria. You can request the policy for more details. You can also request other plan documents for your review

## 2022-09-20 NOTE — Telephone Encounter (Signed)
Mychart message sent.

## 2022-09-20 NOTE — Telephone Encounter (Signed)
PA initiated via Covermymeds; KEY: [id number]. Awaiting determination.

## 2022-09-20 NOTE — Patient Instructions (Addendum)
We are prescribing continuous glucose monitor. If you need training, please set up a nurse visit.  Return your stool test at your earliest convenience  Vaccines I recommend: Covid booster Flu shot this fall Shingrix (shingles)  Will restart cholesterol  medication with results    GO TO THE LAB : Get the blood work     GO TO THE FRONT DESK, PLEASE SCHEDULE YOUR APPOINTMENTS Come back for checkup in 3 months      "Health Care Power of attorney" ,  "Living will" (Advance care planning documents)  If you already have a living will or healthcare power of attorney, is recommended you bring the copy to be scanned in your chart.   The document will be available to all the doctors you see in the system.  Advance care planning is a process that supports adults in  understanding and sharing their preferences regarding future medical care.  The patient's preferences are recorded in documents called Advance Directives and the can be modified at any time while the patient is in full mental capacity.   If you don't have one, please consider create one.      More information at: [email or web address]       Per our records you are due soon for your diabetic eye exam. Please contact your eye doctor to schedule an appointment. Please have them send copies of your office visit notes to [place]. Our fax number is [phone]. If you need a referral to an eye doctor please let us know.

## 2022-09-22 ENCOUNTER — Encounter: Payer: Self-pay | Admitting: Internal Medicine

## 2022-09-22 DIAGNOSIS — J45909 Unspecified asthma, uncomplicated: Secondary | ICD-10-CM | POA: Insufficient documentation

## 2022-09-22 NOTE — Assessment & Plan Note (Signed)
Here for CPX -Td  2017 - pnm 23: 2017 - Vaccines I recommend:Shingrix, COVID booster, flu shot every fall -CCS: 3 options discussed, scheduling a colonoscopy has been a challenge, has not returned Cologuard thus we agreed on IFOB -prostate ca screening: No symptoms, check PSA -Diet, exercise discussed  -Labs: CMP, FLP, CBC, A1c, micro, PSA - Care POA: Information provided

## 2022-09-22 NOTE — Assessment & Plan Note (Signed)
Here for CPX DM: On Jardiance, metformin.  Ambulatory CBGs usually 160, 170 (nonfasting).  Patient is interested in a CGM, Rx sent.  Checking labs. High cholesterol: Ran out of atorvastatin months ago, would like to see how his cholesterol is w/o it.  Check a FLP but advised patient that with results I will restart statins because he has diabetes Bronchospasm: Reports wheezing with URIs or severe hot weather, most likely has reactive airway disease, the school nurse gave him albuterol to be used as needed, use it infrequently.  Rx sent. RTC 3 months

## 2022-09-23 ENCOUNTER — Other Ambulatory Visit: Payer: Self-pay | Admitting: *Deleted

## 2022-09-23 MED ORDER — ATORVASTATIN CALCIUM 20 MG PO TABS: 20.0000 mg | ORAL_TABLET | Freq: Every day | ORAL | 1 refills | Status: DC

## 2022-09-30 ENCOUNTER — Other Ambulatory Visit: Payer: Self-pay | Admitting: Internal Medicine

## 2022-10-04 LAB — HM DIABETES EYE EXAM

## 2022-10-08 ENCOUNTER — Encounter: Payer: Self-pay | Admitting: Optometry

## 2022-11-05 ENCOUNTER — Encounter: Payer: Self-pay | Admitting: Internal Medicine

## 2023-01-10 ENCOUNTER — Ambulatory Visit: Payer: BC Managed Care – PPO | Admitting: Internal Medicine

## 2023-01-10 VITALS — BP 124/70 | HR 89 | Temp 97.9°F | Resp 16 | Ht 71.0 in | Wt 172.1 lb

## 2023-01-10 DIAGNOSIS — E119 Type 2 diabetes mellitus without complications: Secondary | ICD-10-CM | POA: Diagnosis not present

## 2023-01-10 DIAGNOSIS — Z7984 Long term (current) use of oral hypoglycemic drugs: Secondary | ICD-10-CM

## 2023-01-10 DIAGNOSIS — Z0000 Encounter for general adult medical examination without abnormal findings: Secondary | ICD-10-CM

## 2023-01-10 DIAGNOSIS — E785 Hyperlipidemia, unspecified: Secondary | ICD-10-CM

## 2023-01-10 LAB — LIPID PANEL
Cholesterol: 126 mg/dL (ref 0–200)
HDL: 40.9 mg/dL (ref >39.00)
LDL Cholesterol: 72 mg/dL (ref 0–99)
NonHDL: 84.98
Total CHOL/HDL Ratio: 3
Triglycerides: 65 mg/dL (ref 0.0–149.0)
VLDL: 13 mg/dL (ref 0.0–40.0)

## 2023-01-10 LAB — FECAL OCCULT BLOOD, IMMUNOCHEMICAL: Fecal Occult Bld: NEGATIVE

## 2023-01-10 LAB — AST: AST: 31 U/L (ref 0–37)

## 2023-01-10 LAB — ALT: ALT: 43 U/L (ref 0–53)

## 2023-01-10 LAB — HEMOGLOBIN A1C: Hgb A1c MFr Bld: 6.9 % — ABNORMAL HIGH (ref 4.6–6.5)

## 2023-01-10 NOTE — Progress Notes (Signed)
   Subjective:    Patient ID: Samuel Rodriguez, male    DOB: Apr 24, 1965, 57 y.o.   MRN: [id number]  DOS:  01/10/2023 Type of visit - description: Routine checkup  To discuss diabetes and high cholesterol. Doing very well with diet and exercise.  Wt Readings from Last 3 Encounters:  01/10/23 172 lb 2 oz (78.1 kg)  09/20/22 182 lb 6 oz (82.7 kg)  07/02/21 177 lb 2 oz (80.3 kg)     Review of Systems See above   Past Medical History:  Diagnosis Date   Chronic back pain    Dr. Wynetta Emery, "chipped disc"   Diabetes mellitus (HCC)    Glaucoma suspect    History of chicken pox     Past Surgical History:  Procedure Laterality Date   NO PAST SURGERIES      Current Outpatient Medications  Medication Instructions   albuterol (VENTOLIN HFA) 108 (90 Base) MCG/ACT inhaler 2 puffs, Inhalation, Every 6 hours PRN   atorvastatin (LIPITOR) 20 mg, Oral, Daily   empagliflozin (JARDIANCE) 10 mg, Oral, Daily before breakfast   glucose blood test strip Check blood sugar no more than twice daily   Lancets (ONETOUCH ULTRASOFT) lancets Check blood sugar no more than twice daily.   metFORMIN (GLUCOPHAGE) 1,000 mg, Oral, 2 times daily with meals   Multiple Vitamin (MULTIVITAMIN) tablet 1 tablet, Oral, Daily       Objective:   Physical Exam BP 124/70   Pulse 89   Temp 97.9 F (36.6 C) (Oral)   Resp 16   Ht 5\' 11"  (1.803 m)   Wt 172 lb 2 oz (78.1 kg)   SpO2 96%   BMI 24.01 kg/m  General:   Well developed, NAD, BMI noted. HEENT:  Normocephalic . Face symmetric, atraumatic DM foot exam: No edema, good pedal pulses, pinprick examination: Decreased distally at the plantar area and both sides Skin: Not pale. Not jaundice Neurologic:  alert & oriented X3.  Speech normal, gait appropriate for age and unassisted Psych--  Cognition and judgment appear intact.  Cooperative with normal attention span and concentration.  Behavior appropriate. No anxious or depressed appearing.       Assessment     Assessment: DM A1C 6.3 (04-2015) Dyslipidemia Subacute sensorimotor polyneuropathy, demyelinating-slowing and axon loss?  Likely d/t DM.  W/U 2017: Normal CSF. Normal ESR, SSA/B, copper, SPEP with IFE, vitamin B1, TSH, vitamin B12.  Follow clinically, may need to repeat NCS/EMG if symptoms worsen Benign fasciculations  Neuro 2017: could be related to foraminal stenosis since he has both L4 and L5 nerve impingement previously seen on MRI. Repeat MRI lumbar spine if he develops new weakness or pain Back pain  11-2013, MRI, saw Dr Wynetta Emery, Left L3-L4 problem, was rx surgery, only did PT, strength returned w/ PT Syncope 2 , 2017: saw cards, "cough mediated", pt didn't pursue a echo Increase LFTs: Hep B, hep C -11-2018  PLAN DM: Last A1c 7.2, on Jardiance and metformin.  Since then he is doing much better with diet and exercise, + weight loss noted.  Recheck A1c, further advised for results. Neuropathy, feet care discussed with the patient. High cholesterol: Last FLP was done when he ran out of atorvastatin, he has been taking it for the last 3 months.  FLP AST ALT. RTC 5 to 6 months

## 2023-01-10 NOTE — Assessment & Plan Note (Signed)
DM: Last A1c 7.2, on Jardiance and metformin.  Since then he is doing much better with diet and exercise, + weight loss noted.  Recheck A1c, further advised for results. Neuropathy, feet care discussed with the patient. High cholesterol: Last FLP was done when he ran out of atorvastatin, he has been taking it for the last 3 months.  FLP AST ALT. RTC 5 to 6 months

## 2023-01-10 NOTE — Patient Instructions (Signed)
Diabetes: You can check your sugars at different times, they right times to do it are: - early in AM fasting  ( blood sugar goal 70-130) - 2 hours after a meal (blood sugar goal less than 180)       GO TO THE LAB : Get the blood work     Next visit with me 5 to 6 months     Please schedule it at the front desk

## 2023-03-17 ENCOUNTER — Other Ambulatory Visit: Payer: Self-pay | Admitting: Internal Medicine

## 2023-06-03 ENCOUNTER — Ambulatory Visit (INDEPENDENT_AMBULATORY_CARE_PROVIDER_SITE_OTHER): Admitting: Internal Medicine

## 2023-06-03 ENCOUNTER — Encounter: Payer: Self-pay | Admitting: Internal Medicine

## 2023-06-03 VITALS — BP 138/88 | HR 82 | Temp 98.2°F | Resp 16 | Ht 71.0 in | Wt 170.5 lb

## 2023-06-03 DIAGNOSIS — F419 Anxiety disorder, unspecified: Secondary | ICD-10-CM | POA: Diagnosis not present

## 2023-06-03 DIAGNOSIS — Z566 Other physical and mental strain related to work: Secondary | ICD-10-CM

## 2023-06-03 NOTE — Progress Notes (Addendum)
   Subjective:    Patient ID: Samuel Rodriguez, male    DOB: 1965-09-17, 58 y.o.   MRN: [id number]  DOS:  06/03/2023 Type of visit - description: Acute  A lot of stress, work-related. Due to some issues at work, he is  on paid suspension.  This happened approximately 5 days ago and since then he has been sad, has not been able to sleep well, has noted weight loss. Denies suicidal or homicidal ideas.  Review of Systems See above   Past Medical History:  Diagnosis Date   Chronic back pain    Dr. Lamon Pillow, "chipped disc"   Diabetes mellitus (HCC)    Glaucoma suspect    History of chicken pox     Past Surgical History:  Procedure Laterality Date   NO PAST SURGERIES      Current Outpatient Medications  Medication Instructions   albuterol  (VENTOLIN  HFA) 108 (90 Base) MCG/ACT inhaler 2 puffs, Inhalation, Every 6 hours PRN   atorvastatin  (LIPITOR) 20 mg, Oral, Daily   empagliflozin  (JARDIANCE ) 10 mg, Oral, Daily before breakfast   glucose blood test strip Check blood sugar no more than twice daily   Lancets (ONETOUCH ULTRASOFT) lancets Check blood sugar no more than twice daily.   metFORMIN  (GLUCOPHAGE ) 1,000 mg, Oral, 2 times daily with meals   Multiple Vitamin (MULTIVITAMIN) tablet 1 tablet, Daily       Objective:   Physical Exam BP 138/88   Pulse 82   Temp 98.2 F (36.8 C) (Oral)   Resp 16   Ht 5\' 11"  (1.803 m)   Wt 170 lb 8 oz (77.3 kg)   SpO2 98%   BMI 23.78 kg/m  General:   Well developed, NAD, BMI noted. HEENT:  Normocephalic . Face symmetric, atraumatic Skin: Not pale. Not jaundice Neurologic:  alert & oriented X3.  Speech normal, gait appropriate for age and unassisted Psych--  Cognition and judgment appear intact.  Cooperative with normal attention span and concentration.  Behavior appropriate. Moderately depressed and slightly anxious appearing     Assessment     Assessment: DM A1C 6.3 (04-2015) Dyslipidemia Subacute sensorimotor polyneuropathy,  demyelinating-slowing and axon loss?  Likely d/t DM.  W/U 2017: Normal CSF. Normal ESR, SSA/B, copper , SPEP with IFE, vitamin B1, TSH, vitamin B12.  Follow clinically, may need to repeat NCS/EMG if symptoms worsen Benign fasciculations  Neuro 2017: could be related to foraminal stenosis since he has both L4 and L5 nerve impingement previously seen on MRI. Repeat MRI lumbar spine if he develops new weakness or pain Back pain  11-2013, MRI, saw Dr Lamon Pillow, Left L3-L4 problem, was rx surgery, only did PT, strength returned w/ PT Syncope 2 , 2017: saw cards, "cough mediated", pt didn't pursue a echo Increase LFTs: Hep B, hep C -11-2018  PLAN Anxiety, depression: Work-related, currently on a paid suspension. Extensive listening therapy provided. PHQ-9: 8.  GAD: 6 No suicidal ideas. Plan: Recommend to see a counselor, information provided. He is Psychologist, prison and probation services, praised Also recommend to increase exercise To notify me if at some point he feels needs a medication. RTC already scheduled.

## 2023-06-03 NOTE — Patient Instructions (Addendum)
 Go to the front desk, arrange a visit with one of her counselor. Mrs. Flaharty works in this office.  Call if you ever feel you need medication. Has an appointment with me soon

## 2023-06-04 NOTE — Assessment & Plan Note (Signed)
 Anxiety, depression: Work-related, currently on a paid suspension. Extensive listening therapy provided. PHQ-9: 8.  GAD: 6 No suicidal ideas. Plan: Recommend to see a counselor, information provided. He is Psychologist, prison and probation services, praised Also recommend to increase exercise To notify me if at some point he feels needs a medication. RTC already scheduled.

## 2023-06-05 ENCOUNTER — Ambulatory Visit: Admitting: Behavioral Health

## 2023-06-13 ENCOUNTER — Ambulatory Visit: Payer: BC Managed Care – PPO | Admitting: Internal Medicine

## 2023-06-13 ENCOUNTER — Encounter: Payer: Self-pay | Admitting: Internal Medicine

## 2023-06-13 VITALS — BP 116/80 | HR 70 | Temp 97.7°F | Resp 16 | Ht 71.0 in | Wt 170.5 lb

## 2023-06-13 DIAGNOSIS — E785 Hyperlipidemia, unspecified: Secondary | ICD-10-CM | POA: Diagnosis not present

## 2023-06-13 DIAGNOSIS — Z7984 Long term (current) use of oral hypoglycemic drugs: Secondary | ICD-10-CM | POA: Diagnosis not present

## 2023-06-13 DIAGNOSIS — E119 Type 2 diabetes mellitus without complications: Secondary | ICD-10-CM | POA: Diagnosis not present

## 2023-06-13 DIAGNOSIS — F419 Anxiety disorder, unspecified: Secondary | ICD-10-CM | POA: Diagnosis not present

## 2023-06-13 LAB — BASIC METABOLIC PANEL WITH GFR
BUN: 18 mg/dL (ref 6–23)
CO2: 31 meq/L (ref 19–32)
Calcium: 9.4 mg/dL (ref 8.4–10.5)
Chloride: 101 meq/L (ref 96–112)
Creatinine, Ser: 0.81 mg/dL (ref 0.40–1.50)
GFR: 96.41 mL/min (ref >60.00)
Glucose, Bld: 141 mg/dL — ABNORMAL HIGH (ref 70–99)
Potassium: 4.7 meq/L (ref 3.5–5.1)
Sodium: 139 meq/L (ref 135–145)

## 2023-06-13 LAB — MICROALBUMIN / CREATININE URINE RATIO
Creatinine,U: 104.2 mg/dL
Microalb Creat Ratio: UNDETERMINED mg/g (ref 0.0–30.0)
Microalb, Ur: <0.7 mg/dL

## 2023-06-13 LAB — HEMOGLOBIN A1C: Hgb A1c MFr Bld: 7 % — ABNORMAL HIGH (ref 4.6–6.5)

## 2023-06-13 NOTE — Progress Notes (Signed)
   Subjective:    Patient ID: Samuel Rodriguez, male    DOB: February 11, 1966, 58 y.o.   MRN: [id number]  DOS:  06/13/2023 Type of visit - description: Routine follow-up  Chronic medical problems addressed. See LOV, + anxiety depression.  Fortunately he is managing things well.     Wt Readings from Last 3 Encounters:  06/13/23 170 lb 8 oz (77.3 kg)  06/03/23 170 lb 8 oz (77.3 kg)  01/10/23 172 lb 2 oz (78.1 kg)     Review of Systems See above   Past Medical History:  Diagnosis Date   Chronic back pain    Dr. Lamon Pillow, "chipped disc"   Diabetes mellitus (HCC)    Glaucoma suspect    History of chicken pox     Past Surgical History:  Procedure Laterality Date   NO PAST SURGERIES      Current Outpatient Medications  Medication Instructions   albuterol  (VENTOLIN  HFA) 108 (90 Base) MCG/ACT inhaler 2 puffs, Inhalation, Every 6 hours PRN   atorvastatin  (LIPITOR) 20 mg, Oral, Daily   empagliflozin  (JARDIANCE ) 10 mg, Oral, Daily before breakfast   glucose blood test strip Check blood sugar no more than twice daily   Lancets (ONETOUCH ULTRASOFT) lancets Check blood sugar no more than twice daily.   metFORMIN  (GLUCOPHAGE ) 1,000 mg, Oral, 2 times daily with meals   Multiple Vitamin (MULTIVITAMIN) tablet 1 tablet, Daily       Objective:   Physical Exam BP 116/80   Pulse 70   Temp 97.7 F (36.5 C) (Oral)   Resp 16   Ht 5\' 11"  (1.803 m)   Wt 170 lb 8 oz (77.3 kg)   SpO2 97%   BMI 23.78 kg/m  General:   Well developed, NAD, BMI noted. HEENT:  Normocephalic . Face symmetric, atraumatic   Lower extremities: no pretibial edema bilaterally  Skin: Not pale. Not jaundice Neurologic:  alert & oriented X3.  Speech normal, gait appropriate for age and unassisted Psych--  Cognition and judgment appear intact.  Cooperative with normal attention span and concentration.  Behavior appropriate. No anxious or depressed appearing.      Assessment   Assessment: DM A1C 6.3  (04-2015) Dyslipidemia Subacute sensorimotor polyneuropathy, demyelinating-slowing and axon loss?  Likely d/t DM.  W/U 2017: Normal CSF. Normal ESR, SSA/B, copper , SPEP with IFE, vitamin B1, TSH, vitamin B12.  Follow clinically, may need to repeat NCS/EMG if symptoms worsen Benign fasciculations  Neuro 2017: could be related to foraminal stenosis since he has both L4 and L5 nerve impingement previously seen on MRI. Repeat MRI lumbar spine if he develops new weakness or pain Back pain  11-2013, MRI, saw Dr Lamon Pillow, Left L3-L4 problem, was rx surgery, only did PT, strength returned w/ PT Syncope 2 , 2017: saw cards, "cough mediated", pt didn't pursue a echo Increase LFTs: Hep B, hep C -11-2018  PLAN  DM: Last A1c slightly improved to 6.9, on Jardiance , metformin .  Check A1c and micro High cholesterol: On atorvastatin , last LDL 72, November 2024.  No change Anxiety depression: Work-related, since the last visit has been doing a lot of thinking and self therapy, has reach out to friends and family, has increased his physical activity.  Feeling okay. Has not pursue psychotherapy but plans to do if necessary He knows to reach out if he feels the need for medication. RTC 09-2023 CPX

## 2023-06-13 NOTE — Patient Instructions (Addendum)
 It was good to see you today.      GO TO THE LAB : Get the blood work     Next office visit for a physical exam by  8/ 2025  Please make an appointment before you leave today

## 2023-06-15 NOTE — Assessment & Plan Note (Signed)
 DM: Last A1c slightly improved to 6.9, on Jardiance , metformin .  Check A1c and micro High cholesterol: On atorvastatin , last LDL 72, November 2024.  No change Anxiety depression: Work-related, since the last visit has been doing a lot of thinking and self therapy, has reach out to friends and family, has increased his physical activity.  Feeling okay. Has not pursue psychotherapy but plans to do if necessary He knows to reach out if he feels the need for medication. RTC 09-2023 CPX

## 2023-06-18 ENCOUNTER — Encounter: Payer: Self-pay | Admitting: Internal Medicine

## 2023-06-18 MED ORDER — ATORVASTATIN CALCIUM 20 MG PO TABS: 20.0000 mg | ORAL_TABLET | Freq: Every day | ORAL | 1 refills | Status: DC

## 2023-06-18 MED ORDER — EMPAGLIFLOZIN 10 MG PO TABS: 10.0000 mg | ORAL_TABLET | Freq: Every day | ORAL | 1 refills | Status: DC

## 2023-09-05 ENCOUNTER — Encounter: Payer: Self-pay | Admitting: Internal Medicine

## 2023-09-05 LAB — HM DIABETES EYE EXAM

## 2023-09-16 ENCOUNTER — Other Ambulatory Visit: Payer: Self-pay | Admitting: Internal Medicine

## 2023-11-14 ENCOUNTER — Encounter: Admitting: Internal Medicine

## 2023-11-19 ENCOUNTER — Other Ambulatory Visit (HOSPITAL_COMMUNITY): Payer: Self-pay

## 2024-03-01 ENCOUNTER — Encounter: Payer: Self-pay | Admitting: Internal Medicine

## 2024-03-01 ENCOUNTER — Ambulatory Visit: Admitting: Internal Medicine

## 2024-03-01 VITALS — BP 126/80 | HR 72 | Temp 97.9°F | Resp 16 | Ht 71.0 in | Wt 172.2 lb

## 2024-03-01 DIAGNOSIS — E785 Hyperlipidemia, unspecified: Secondary | ICD-10-CM | POA: Diagnosis not present

## 2024-03-01 DIAGNOSIS — Z7984 Long term (current) use of oral hypoglycemic drugs: Secondary | ICD-10-CM | POA: Diagnosis not present

## 2024-03-01 DIAGNOSIS — E1142 Type 2 diabetes mellitus with diabetic polyneuropathy: Secondary | ICD-10-CM | POA: Diagnosis not present

## 2024-03-01 DIAGNOSIS — Z114 Encounter for screening for human immunodeficiency virus [HIV]: Secondary | ICD-10-CM

## 2024-03-01 DIAGNOSIS — Z0000 Encounter for general adult medical examination without abnormal findings: Secondary | ICD-10-CM | POA: Diagnosis not present

## 2024-03-01 DIAGNOSIS — Z23 Encounter for immunization: Secondary | ICD-10-CM

## 2024-03-01 DIAGNOSIS — E119 Type 2 diabetes mellitus without complications: Secondary | ICD-10-CM

## 2024-03-01 LAB — HEMOGLOBIN A1C: Hgb A1c MFr Bld: 7.1 % — ABNORMAL HIGH (ref 4.6–6.5)

## 2024-03-01 LAB — COMPREHENSIVE METABOLIC PANEL WITH GFR
ALT: 35 U/L (ref 3–53)
AST: 25 U/L (ref 5–37)
Albumin: 4.7 g/dL (ref 3.5–5.2)
Alkaline Phosphatase: 67 U/L (ref 39–117)
BUN: 14 mg/dL (ref 6–23)
CO2: 28 meq/L (ref 19–32)
Calcium: 9.6 mg/dL (ref 8.4–10.5)
Chloride: 103 meq/L (ref 96–112)
Creatinine, Ser: 0.85 mg/dL (ref 0.40–1.50)
GFR: 94.54 mL/min
Glucose, Bld: 135 mg/dL — ABNORMAL HIGH (ref 70–99)
Potassium: 4.2 meq/L (ref 3.5–5.1)
Sodium: 140 meq/L (ref 135–145)
Total Bilirubin: 0.7 mg/dL (ref 0.2–1.2)
Total Protein: 7.3 g/dL (ref 6.0–8.3)

## 2024-03-01 LAB — CBC WITH DIFFERENTIAL/PLATELET
Basophils Absolute: 0 K/uL (ref 0.0–0.1)
Basophils Relative: 0.4 % (ref 0.0–3.0)
Eosinophils Absolute: 0 K/uL (ref 0.0–0.7)
Eosinophils Relative: 0.9 % (ref 0.0–5.0)
HCT: 47.2 % (ref 39.0–52.0)
Hemoglobin: 16.2 g/dL (ref 13.0–17.0)
Lymphocytes Relative: 43.3 % (ref 12.0–46.0)
Lymphs Abs: 2.5 K/uL (ref 0.7–4.0)
MCHC: 34.2 g/dL (ref 30.0–36.0)
MCV: 89.4 fl (ref 78.0–100.0)
Monocytes Absolute: 0.3 K/uL (ref 0.1–1.0)
Monocytes Relative: 4.6 % (ref 3.0–12.0)
Neutro Abs: 2.9 K/uL (ref 1.4–7.7)
Neutrophils Relative %: 50.8 % (ref 43.0–77.0)
Platelets: 237 K/uL (ref 150.0–400.0)
RBC: 5.29 Mil/uL (ref 4.22–5.81)
RDW: 13.7 % (ref 11.5–15.5)
WBC: 5.7 K/uL (ref 4.0–10.5)

## 2024-03-01 LAB — LIPID PANEL
Cholesterol: 124 mg/dL (ref 28–200)
HDL: 45.5 mg/dL
LDL Cholesterol: 57 mg/dL (ref 10–99)
NonHDL: 78.72
Total CHOL/HDL Ratio: 3
Triglycerides: 110 mg/dL (ref 10.0–149.0)
VLDL: 22 mg/dL (ref 0.0–40.0)

## 2024-03-01 LAB — PSA: PSA: 0.39 ng/mL (ref 0.10–4.00)

## 2024-03-01 LAB — MICROALBUMIN / CREATININE URINE RATIO
Creatinine,U: 62.5 mg/dL
Microalb Creat Ratio: UNDETERMINED mg/g (ref 0.0–30.0)
Microalb, Ur: <0.7 mg/dL

## 2024-03-01 NOTE — Assessment & Plan Note (Signed)
 Here for CPX -Td  2017 - pnm 23: 2017; PNM 20 today  - s/p shingrix x 2 - Had a flu shot - Vaccines I recommend:  COVID booster  -CCS: Had a negative Hemoccult 2024, again 3 options discussed, scheduling a colonoscopy has been a challenge, patient elected another Hemoccult. -prostate ca screening: No symptoms, check PSA - Lifestyle: Encouraged to continue eating healthy, staying active.  He is not sexually active -Labs: CMP FLP CBC A1c micro PSA hep B HIV

## 2024-03-01 NOTE — Progress Notes (Signed)
 "  Subjective:    Patient ID: Samuel Rodriguez, male    DOB: May 11, 1965, 59 y.o.   MRN: [id number]  DOS:  03/01/2024 CPX Here for CPX. Other than  irritation on the inner lower lip -that is basically resolved-  he feels well. Chronic medical problems addressed   Review of Systems  Other than above, a 14 point review of systems is negative    Past Medical History:  Diagnosis Date   Chronic back pain    Dr. Onetha, chipped disc   Diabetes mellitus (HCC)    Glaucoma suspect    History of chicken pox     Past Surgical History:  Procedure Laterality Date   NO PAST SURGERIES      Current Outpatient Medications  Medication Instructions   albuterol  (VENTOLIN  HFA) 108 (90 Base) MCG/ACT inhaler 2 puffs, Inhalation, Every 6 hours PRN   atorvastatin  (LIPITOR) 20 mg, Oral, Daily   empagliflozin  (JARDIANCE ) 10 mg, Oral, Daily before breakfast   glucose blood test strip Check blood sugar no more than twice daily   Lancets (ONETOUCH ULTRASOFT) lancets Check blood sugar no more than twice daily.   metFORMIN  (GLUCOPHAGE ) 1,000 mg, Oral, 2 times daily with meals   Multiple Vitamin (MULTIVITAMIN) tablet 1 tablet, Daily       Objective:   Physical Exam BP 126/80   Pulse 72   Temp 97.9 F (36.6 C) (Oral)   Resp 16   Ht 5' 11 (1.803 m)   Wt 172 lb 4 oz (78.1 kg)   SpO2 97%   BMI 24.02 kg/m  General: Well developed, NAD, BMI noted Neck: No  thyromegaly  HEENT:  Normocephalic . Face symmetric, atraumatic Lungs:  CTA B Normal respiratory effort, no intercostal retractions, no accessory muscle use. Heart: RRR,  no murmur.  Abdomen:  Not distended, soft, non-tender. No rebound or rigidity.   DM foot exam: Good pedal pulses, skin normal, toes perfused, pinprick examination: Decreased distally Skin: Exposed areas without rash. Not pale. Not jaundice Neurologic:  alert & oriented X3.  Speech normal, gait appropriate for age and unassisted Strength symmetric and appropriate for  age.  Psych: Cognition and judgment appear intact.  Cooperative with normal attention span and concentration.  Behavior appropriate. No anxious or depressed appearing.     Assessment   Assessment: DM A1C 6.3 (04-2015) Dyslipidemia Subacute sensorimotor polyneuropathy, demyelinating-slowing and axon loss?  Likely d/t DM.  W/U 2017: Normal CSF. Normal ESR, SSA/B, copper , SPEP with IFE, vitamin B1, TSH, vitamin B12.  Follow clinically, may need to repeat NCS/EMG if symptoms worsen Benign fasciculations  Neuro 2017: could be related to foraminal stenosis since he has both L4 and L5 nerve impingement previously seen on MRI. Repeat MRI lumbar spine if he develops new weakness or pain Back pain  11-2013, MRI, saw Dr Onetha, Left L3-L4 problem, was rx surgery, only did PT, strength returned w/ PT Syncope 2 , 2017: saw cards, cough mediated, pt didn't pursue a echo Increase LFTs: Hep B, hep C -11-2018  PLAN Here for CPX -Td  2017 - pnm 23: 2017; PNM 20 today  - s/p shingrix x 2 - Had a flu shot - Vaccines I recommend:  COVID booster  -CCS: Had a negative Hemoccult 2024, again 3 options discussed, scheduling a colonoscopy has been a challenge, patient elected another Hemoccult. -prostate ca screening: No symptoms, check PSA - Lifestyle: Encouraged to continue eating healthy, staying active.  He is not sexually active -Labs: CMP FLP CBC  A1c micro PSA hep B HIV   Other issues: DM w/ neuropathy: Last A1c 7.0, similar to previous readings, on metformin , Jardiance .  Checking labs. Neuropathy: Feet care discussed, he does follow good practices. High cholesterol: On atorvastatin , checking labs. Anxiety depression: On no medicines, had some issues at work, they seem to be resolving. GAD PHQ-9: 0. RTC 6 months    "

## 2024-03-01 NOTE — Patient Instructions (Signed)
 Please read your instructions carefully.   GO TO THE LAB :  Get the blood work    Go to the front desk for the checkout Please make an appointment for a checkup in 6 months   Return the stool test at your convenience  Please read the information about neuropathy below Diabetes, also called diabetes mellitus, may cause problems with your feet and legs because of poor blood flow (circulation). Poor circulation may make your skin: Become thinner and drier. Break more easily. Heal more slowly. Peel and crack. You may also have nerve damage (neuropathy). This can cause decreased feeling in your legs and feet. This means that you may not notice minor injuries to your feet that could lead to more serious problems. Finding and treating problems early is the best way to prevent future foot problems. How to care for your feet Foot hygiene  Wash your feet daily with warm water and mild soap. Do not use hot water. Then, pat your feet and the areas between your toes until they are fully dry. Do not soak your feet. This can dry your skin. Trim your toenails straight across. Do not dig under them or around the cuticle. File the edges of your nails with an emery board or nail file. Apply a moisturizing lotion or petroleum jelly to the skin on your feet and to dry, brittle toenails. Use lotion that does not contain alcohol and is unscented. Do not apply lotion between your toes. Shoes and socks Wear clean socks or stockings every day. Make sure they are not too tight. Do not wear knee-high stockings. These may decrease blood flow to your legs. Wear shoes that fit well and have enough cushioning. Always look in your shoes before you put them on to be sure there are no objects inside. To break in new shoes, wear them for just a few hours a day. This prevents injuries on your feet. Wounds, scrapes, corns, and calluses  Check your feet daily for blisters, cuts, bruises, sores, and redness. If you cannot see  the bottom of your feet, use a mirror or ask someone for help. Do not cut off corns or calluses or try to remove them with medicine. If you find a minor scrape, cut, or break in the skin on your feet, keep it and the skin around it clean and dry. You may clean these areas with mild soap and water. Do not clean the area with peroxide, alcohol, or iodine. If you have a wound, scrape, corn, or callus on your foot, look at it several times a day to make sure it is healing and not infected. Check for: Redness, swelling, or pain. Fluid or blood. Warmth. Pus or a bad smell. General tips Do not cross your legs. This may decrease blood flow to your feet. Do not use heating pads or hot water bottles on your feet. They may burn your skin. If you have lost feeling in your feet or legs, you may not know this is happening until it is too late. Protect your feet from hot and cold by wearing shoes, such as at the beach or on hot pavement. Schedule a complete foot exam at least once a year or more often if you have foot problems. Report any cuts, sores, or bruises to your health care provider right away. Where to find more information American Diabetes Association: diabetes.org Association [employer] Education Specialists: diabeteseducator.org Contact a health care provider if: You have a condition that  increases your risk of infection, and you have any cuts, sores, or bruises on your feet. You have an injury that is not healing. You have redness on your legs or feet. You feel burning or tingling in your legs or feet. You have pain or cramps in your legs and feet. Your legs or feet are numb. Your feet always feel cold. You have pain around any toenails. Get help right away if: You have a wound, scrape, corn, or callus on your foot and: You have signs of infection. You have a fever. You have a red line going up your leg. This information is not intended to replace advice given to you by your  health care provider. Make sure you discuss any questions you have with your health care provider. Document Revised: 08/08/2021 Document Reviewed: 08/08/2021 Elsevier Patient Education  2024 [employer].

## 2024-03-01 NOTE — Assessment & Plan Note (Signed)
 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  Checking labs. Neuropathy: Feet care discussed, he does follow good practices. High cholesterol: On atorvastatin , checking labs. Anxiety depression: On no medicines, had some issues at work, they seem to be resolving. GAD PHQ-9: 0. RTC 6 months

## 2024-03-02 LAB — HEPATITIS B SURFACE ANTIGEN: Hepatitis B Surface Ag: NONREACTIVE

## 2024-03-02 LAB — HIV ANTIBODY (ROUTINE TESTING W REFLEX)
HIV 1&2 Ab, 4th Generation: NONREACTIVE
HIV FINAL INTERPRETATION: NEGATIVE

## 2024-03-02 LAB — HEPATITIS B SURFACE ANTIBODY,QUALITATIVE: Hep B S Ab: REACTIVE — AB

## 2024-03-04 ENCOUNTER — Ambulatory Visit: Payer: Self-pay | Admitting: Internal Medicine

## 2024-03-04 DIAGNOSIS — E119 Type 2 diabetes mellitus without complications: Secondary | ICD-10-CM

## 2024-03-04 MED ORDER — EMPAGLIFLOZIN 25 MG PO TABS: 25.0000 mg | ORAL_TABLET | Freq: Every day | ORAL | 1 refills | Status: DC

## 2024-03-11 ENCOUNTER — Other Ambulatory Visit: Payer: Self-pay | Admitting: Internal Medicine

## 2024-03-11 MED ORDER — EMPAGLIFLOZIN 25 MG PO TABS: 25.0000 mg | ORAL_TABLET | Freq: Every day | ORAL | 1 refills | Status: AC

## 2024-09-24 ENCOUNTER — Ambulatory Visit: Admitting: Internal Medicine
